# Patient Record
Sex: Female | Born: 1995 | Race: Black or African American | Hispanic: No | Marital: Single | Smoking: Never smoker
Health system: Southern US, Community
[De-identification: ages and names within clinical notes are randomized; demographics above are authoritative.]

## PROBLEM LIST (undated history)

## (undated) DIAGNOSIS — E119 Type 2 diabetes mellitus without complications: Secondary | ICD-10-CM

---

## 2013-03-23 ENCOUNTER — Encounter: Payer: BC Managed Care – PPO | Attending: Internal Medicine | Admitting: *Deleted

## 2013-03-23 DIAGNOSIS — E119 Type 2 diabetes mellitus without complications: Secondary | ICD-10-CM

## 2013-03-23 NOTE — Progress Notes (Signed)
Introduction to Insulin Pump Therapy:  Appt start time: 1500 end time:  1630.  Assessment:  This patient has DM 2 and their primary concerns today: intro to pumping, she has already received her Medtronic pump.  This patient is interested in learning more about insulin pump therapy because fewer injections and desire for better BG control  MEDICATIONS: Basal Insulin: 25 units of Lantus at bedtime via pen  Bolus Insulin: 0 units of Humalog at meals  via pen anymore Total of insulin doses per day 25 units Other diabetes medications: metformin  This patient is using Accuchek meter and currently is NOT adjusting bolus insulin based BG  This patient is NOT currently adjusting bolus insulin based on carb intake Patient states knowledge of Carb Counting is none  Usual physical activity: not with cold weather  Last A1c was 12%  Patient states complications from diabetes include not discussed, newly diagnosed 1 year ago Patient states they forget to take their insulin injection on average 2-3 times per week Patient states they have had hypoglycemia 0 times in the past month Patient states their biggest barrier with diabetes is remembering to take shot at night  Patient currently is working as Consulting civil engineer at [employer] and the schedule is variable each day  Progress Towards Obtaining an Insulin PumpGoal(s):  In progress.  Patient states their expectations of pump therapy include: get A1c down Patient expresses understanding that for improved outcomes for their diabetes on an insulin pump they will:  Check BG 3 times per day  Change out pump infusion set at least every 3 days  Upload pump information to software on a regular basis so provider can assess patterns and make setting adjustments.      Intervention:    Taught difference between delivery of insulin via syringe/pen compared to insulin pump.  Demonstrated improved insulin delivery via pump due to improved accuracy of dose and flexibility  of adjusting bolus insulin based on carb intake and BG correction.  Demonstrated pump, insulin reservoir and infusion set options, and button pushing for bolus delivery of insulin through the pump  Explained importance of testing BG at least 4 times per day for appropriate correction of high BG   Emphasized importance of follow up after Pump Start for appropriate pump setting adjustments and on-going training on more advanced features.  Also discussed Carb Counting by food group, she expressed good understanding by food model demonstration   Handouts given during visit include:  Intro to Pumping Handout  Carb Counting and Reading Food Labels handouts  Monitoring/Evaluation:    Patient does want to continue with pursuit of insulin pump.  Patient instructed to make appointment with Dr. Wendy Poet, endocrinologist before pump start can occur.    Follow up pump start to be made once she has seen Dr. Elvera Lennox

## 2013-04-20 ENCOUNTER — Ambulatory Visit: Payer: BC Managed Care – PPO | Admitting: Internal Medicine

## 2013-04-20 DIAGNOSIS — Z0289 Encounter for other administrative examinations: Secondary | ICD-10-CM

## 2014-08-20 ENCOUNTER — Encounter (HOSPITAL_COMMUNITY): Payer: Self-pay

## 2014-08-20 ENCOUNTER — Emergency Department (HOSPITAL_COMMUNITY)
Admission: EM | Admit: 2014-08-20 | Discharge: 2014-08-20 | Disposition: A | Discharge status: Home / Self Care | Payer: BLUE CROSS/BLUE SHIELD | Attending: Emergency Medicine | Admitting: Emergency Medicine

## 2014-08-20 ENCOUNTER — Emergency Department (HOSPITAL_COMMUNITY): Payer: BLUE CROSS/BLUE SHIELD

## 2014-08-20 DIAGNOSIS — R0789 Other chest pain: Secondary | ICD-10-CM | POA: Diagnosis not present

## 2014-08-20 DIAGNOSIS — E1165 Type 2 diabetes mellitus with hyperglycemia: Secondary | ICD-10-CM | POA: Insufficient documentation

## 2014-08-20 DIAGNOSIS — R079 Chest pain, unspecified: Secondary | ICD-10-CM

## 2014-08-20 DIAGNOSIS — Z794 Long term (current) use of insulin: Secondary | ICD-10-CM | POA: Diagnosis not present

## 2014-08-20 DIAGNOSIS — Z79899 Other long term (current) drug therapy: Secondary | ICD-10-CM | POA: Insufficient documentation

## 2014-08-20 HISTORY — DX: Type 2 diabetes mellitus without complications: E11.9

## 2014-08-20 LAB — CBC
HEMATOCRIT: 43.4 % (ref 36.0–46.0)
HEMOGLOBIN: 15.2 g/dL — AB (ref 12.0–15.0)
MCH: 29.9 pg (ref 26.0–34.0)
MCHC: 35 g/dL (ref 30.0–36.0)
MCV: 85.3 fL (ref 78.0–100.0)
Platelets: 193 10*3/uL (ref 150–400)
RBC: 5.09 MIL/uL (ref 3.87–5.11)
RDW: 11.7 % (ref 11.5–15.5)
WBC: 7.9 10*3/uL (ref 4.0–10.5)

## 2014-08-20 LAB — BASIC METABOLIC PANEL
ANION GAP: 12 (ref 5–15)
BUN: 6 mg/dL (ref 6–23)
CO2: 20 mmol/L (ref 19–32)
Calcium: 9.2 mg/dL (ref 8.4–10.5)
Chloride: 98 mmol/L (ref 96–112)
Creatinine, Ser: 0.63 mg/dL (ref 0.50–1.10)
GFR calc Af Amer: >90 mL/min (ref >90)
Glucose, Bld: 589 mg/dL (ref 70–99)
Potassium: 3.8 mmol/L (ref 3.5–5.1)
Sodium: 130 mmol/L — ABNORMAL LOW (ref 135–145)

## 2014-08-20 LAB — BRAIN NATRIURETIC PEPTIDE: B NATRIURETIC PEPTIDE 5: 15.4 pg/mL (ref 0.0–100.0)

## 2014-08-20 LAB — CBG MONITORING, ED
GLUCOSE-CAPILLARY: 335 mg/dL — AB (ref 70–99)
Glucose-Capillary: 552 mg/dL (ref 70–99)

## 2014-08-20 LAB — I-STAT TROPONIN, ED: Troponin i, poc: 0 ng/mL (ref 0.00–0.08)

## 2014-08-20 MED ORDER — KETOROLAC TROMETHAMINE 30 MG/ML IJ SOLN
30.0000 mg | Freq: Once | INTRAMUSCULAR | Status: AC
  Administered 2014-08-20: 30 mg via INTRAVENOUS
  Filled 2014-08-20: qty 1

## 2014-08-20 MED ORDER — SODIUM CHLORIDE 0.9 % IV BOLUS (SEPSIS)
1000.0000 mL | Freq: Once | INTRAVENOUS | Status: AC
  Administered 2014-08-20: 1000 mL via INTRAVENOUS

## 2014-08-20 MED ORDER — IBUPROFEN 800 MG PO TABS: 800.0000 mg | ORAL_TABLET | Freq: Three times a day (TID) | ORAL | Status: DC

## 2014-08-20 MED ORDER — INSULIN ASPART 100 UNIT/ML ~~LOC~~ SOLN
5.0000 [IU] | Freq: Once | SUBCUTANEOUS | Status: AC
  Administered 2014-08-20: 5 [IU] via INTRAVENOUS
  Filled 2014-08-20: qty 1

## 2014-08-20 NOTE — ED Notes (Signed)
Bed: [facility]2 Expected date:  Expected time:  Means of arrival:  Comments: EMS 19 y o F chest wall pain

## 2014-08-20 NOTE — ED Provider Notes (Signed)
CSN: [id number]     Arrival date & time 08/20/14  0252 History   First MD Initiated Contact with Patient 08/20/14 [phone]     Chief Complaint  Patient presents with  . Chest Pain     (Consider location/radiation/quality/duration/timing/severity/associated sxs/prior Treatment) Patient is a 19 y.o. female presenting with chest pain. The history is provided by the patient. No language interpreter was used.  Chest Pain Pain location:  L chest Pain quality: sharp   Pain radiates to:  Does not radiate Pain radiates to the back: no   Pain severity:  Moderate Onset quality:  Gradual Timing:  Constant Progression:  Unchanged Chronicity:  New Context: lifting and movement   Relieved by:  Nothing Worsened by:  Nothing tried Ineffective treatments:  None tried Associated symptoms: no abdominal pain, no fever, no orthopnea, no palpitations and no shortness of breath   Risk factors: no aortic disease, no immobilization and no surgery     Past Medical History  Diagnosis Date  . Diabetes mellitus without complication    History reviewed. No pertinent past surgical history. History reviewed. No pertinent family history. History  Substance Use Topics  . Smoking status: Never Smoker   . Smokeless tobacco: Not on file  . Alcohol Use: No   OB History    No data available     Review of Systems  Constitutional: Negative for fever.  Respiratory: Negative for shortness of breath.   Cardiovascular: Positive for chest pain. Negative for palpitations, orthopnea and leg swelling.  Gastrointestinal: Negative for abdominal pain.  All other systems reviewed and are negative.     Allergies  Review of patient's allergies indicates no known allergies.  Home Medications   Prior to Admission medications   Medication Sig Start Date End Date Taking? Authorizing Provider  insulin aspart (NOVOLOG) 100 UNIT/ML injection Inject 2-15 Units into the skin 3 (three) times daily before meals. Per sliding  scale.   Yes Historical Provider, MD  Insulin Glargine 300 UNIT/ML SOPN Inject 45 Units into the skin at bedtime.   Yes Historical Provider, MD  medroxyPROGESTERone (DEPO-PROVERA) 150 MG/ML injection Inject 150 mg into the muscle every 3 (three) months.   Yes Historical Provider, MD  metFORMIN (GLUCOPHAGE) 500 MG tablet Take 500 mg by mouth 2 (two) times daily with a meal.   Yes Historical Provider, MD  ibuprofen (ADVIL,MOTRIN) 800 MG tablet Take 1 tablet (800 mg total) by mouth 3 (three) times daily. 08/20/14   Daun Rens, MD   BP 118/80 mmHg  Pulse 94  Temp(Src) 97.8 F (36.6 C) (Oral)  Resp 24  Ht 5' (1.524 m)  Wt 152 lb (68.947 kg)  BMI 29.69 kg/m2  SpO2 99% Physical Exam  Constitutional: She is oriented to person, place, and time. She appears well-developed and well-nourished. No distress.  HENT:  Head: Normocephalic and atraumatic.  Mouth/Throat: Oropharynx is clear and moist.  Eyes: Conjunctivae are normal. Pupils are equal, round, and reactive to light.  Neck: Normal range of motion. Neck supple.  Cardiovascular: Normal rate, regular rhythm and intact distal pulses.   Pulmonary/Chest: Effort normal and breath sounds normal. No respiratory distress. She has no wheezes. She has no rales. She exhibits tenderness.  Abdominal: Soft. Bowel sounds are normal. There is no tenderness. There is no rebound and no guarding.  Musculoskeletal: Normal range of motion. She exhibits no edema or tenderness.  Neurological: She is alert and oriented to person, place, and time.  Skin: Skin is warm and dry.  Psychiatric: She has a normal mood and affect.    ED Course  Procedures (including critical care time) Labs Review Labs Reviewed  CBC - Abnormal; Notable for the following:    Hemoglobin 15.2 (*)    All other components within normal limits  BASIC METABOLIC PANEL - Abnormal; Notable for the following:    Sodium 130 (*)    Glucose, Bld 589 (*)    All other components within normal  limits  CBG MONITORING, ED - Abnormal; Notable for the following:    Glucose-Capillary 335 (*)    All other components within normal limits  BRAIN NATRIURETIC PEPTIDE  I-STAT TROPOININ, ED  CBG MONITORING, ED    Imaging Review Dg Chest 2 View  08/20/2014   CLINICAL DATA:  Chest pain for 30 minutes.  Chest tightness.  EXAM: CHEST  2 VIEW  COMPARISON:  None.  FINDINGS: The cardiomediastinal contours are normal. The lungs are clear. Pulmonary vasculature is normal. No consolidation, pleural effusion, or pneumothorax. No acute osseous abnormalities are seen.  IMPRESSION: Normal radiographs of the chest.   Electronically Signed   By: Rubye OaksMelanie  Ehinger M.D.   On: 08/20/2014 04:31     EKG Interpretation   Date/Time:  Friday August 20 2014 03:09:20 EDT Ventricular Rate:  79 PR Interval:  135 QRS Duration: 83 QT Interval:  370 QTC Calculation: 424 R Axis:   61 Text Interpretation:  Sinus rhythm Confirmed by [facility]  MD, Layson Bertsch  ([id number]) on 08/20/2014 4:03:54 AM      MDM   Final diagnoses:  Chest wall pain  Hyperglycemia due to type 2 diabetes mellitus    Wells 0.  No SOB.  Was primarily here as sugars were elevated due to patient not having meds.     Cy BlamerApril Alsie Younes, MD 08/20/14 [phone]

## 2014-08-20 NOTE — Discharge Instructions (Signed)

## 2014-08-20 NOTE — ED Notes (Signed)
Patient arrived via EMS after experiencing chest wall pain for 30 minutes.  No N/V/D, diaphoresis.

## 2014-08-20 NOTE — ED Notes (Signed)
Pt reports left sided chest pain that began approx 0200 this morning - pt states area is tender to palpation. Pt denies cough, dizziness or shortness of breath.

## 2016-05-28 HISTORY — PX: WISDOM TOOTH EXTRACTION: SHX21

## 2017-09-14 ENCOUNTER — Encounter (HOSPITAL_COMMUNITY): Payer: Self-pay | Admitting: Obstetrics and Gynecology

## 2017-09-14 ENCOUNTER — Other Ambulatory Visit: Payer: Self-pay

## 2017-09-14 ENCOUNTER — Emergency Department (HOSPITAL_COMMUNITY): Payer: 59

## 2017-09-14 ENCOUNTER — Emergency Department (HOSPITAL_COMMUNITY)
Admission: EM | Admit: 2017-09-14 | Discharge: 2017-09-15 | Disposition: A | Discharge status: Home / Self Care | Payer: 59 | Attending: Emergency Medicine | Admitting: Emergency Medicine

## 2017-09-14 DIAGNOSIS — Z79899 Other long term (current) drug therapy: Secondary | ICD-10-CM | POA: Diagnosis not present

## 2017-09-14 DIAGNOSIS — R1030 Lower abdominal pain, unspecified: Secondary | ICD-10-CM | POA: Diagnosis present

## 2017-09-14 DIAGNOSIS — N390 Urinary tract infection, site not specified: Secondary | ICD-10-CM | POA: Diagnosis not present

## 2017-09-14 DIAGNOSIS — E119 Type 2 diabetes mellitus without complications: Secondary | ICD-10-CM | POA: Insufficient documentation

## 2017-09-14 DIAGNOSIS — Z794 Long term (current) use of insulin: Secondary | ICD-10-CM | POA: Diagnosis not present

## 2017-09-14 LAB — URINALYSIS, ROUTINE W REFLEX MICROSCOPIC
BILIRUBIN URINE: NEGATIVE
HGB URINE DIPSTICK: NEGATIVE
Ketones, ur: 5 mg/dL — AB
NITRITE: NEGATIVE
Protein, ur: NEGATIVE mg/dL
Specific Gravity, Urine: 1.04 — ABNORMAL HIGH (ref 1.005–1.030)
pH: 7 (ref 5.0–8.0)

## 2017-09-14 LAB — COMPREHENSIVE METABOLIC PANEL
ALT: 17 U/L (ref 14–54)
ANION GAP: 11 (ref 5–15)
AST: 16 U/L (ref 15–41)
Albumin: 4 g/dL (ref 3.5–5.0)
Alkaline Phosphatase: 134 U/L — ABNORMAL HIGH (ref 38–126)
BUN: 6 mg/dL (ref 6–20)
CALCIUM: 9.6 mg/dL (ref 8.9–10.3)
CO2: 26 mmol/L (ref 22–32)
Chloride: 97 mmol/L — ABNORMAL LOW (ref 101–111)
Creatinine, Ser: 0.58 mg/dL (ref 0.44–1.00)
GFR calc Af Amer: >60 mL/min (ref >60)
Glucose, Bld: 402 mg/dL — ABNORMAL HIGH (ref 65–99)
Potassium: 3.9 mmol/L (ref 3.5–5.1)
SODIUM: 134 mmol/L — AB (ref 135–145)
TOTAL PROTEIN: 7.9 g/dL (ref 6.5–8.1)
Total Bilirubin: 0.8 mg/dL (ref 0.3–1.2)

## 2017-09-14 LAB — I-STAT BETA HCG BLOOD, ED (MC, WL, AP ONLY)

## 2017-09-14 LAB — CBC
HCT: 43.2 % (ref 36.0–46.0)
HEMOGLOBIN: 15.3 g/dL — AB (ref 12.0–15.0)
MCH: 30.9 pg (ref 26.0–34.0)
MCHC: 35.4 g/dL (ref 30.0–36.0)
MCV: 87.3 fL (ref 78.0–100.0)
PLATELETS: 214 10*3/uL (ref 150–400)
RBC: 4.95 MIL/uL (ref 3.87–5.11)
RDW: 12.1 % (ref 11.5–15.5)
WBC: 14 10*3/uL — AB (ref 4.0–10.5)

## 2017-09-14 LAB — CBG MONITORING, ED: Glucose-Capillary: 379 mg/dL — ABNORMAL HIGH (ref 65–99)

## 2017-09-14 LAB — LIPASE, BLOOD: Lipase: 21 U/L (ref 11–51)

## 2017-09-14 MED ORDER — MORPHINE SULFATE (PF) 4 MG/ML IV SOLN
4.0000 mg | Freq: Once | INTRAVENOUS | Status: AC
  Administered 2017-09-14: 4 mg via INTRAVENOUS
  Filled 2017-09-14: qty 1

## 2017-09-14 MED ORDER — SODIUM CHLORIDE 0.9 % IV SOLN
1.0000 g | Freq: Once | INTRAVENOUS | Status: AC
  Administered 2017-09-14: 1 g via INTRAVENOUS
  Filled 2017-09-14: qty 10

## 2017-09-14 MED ORDER — ONDANSETRON HCL 4 MG/2ML IJ SOLN
4.0000 mg | Freq: Once | INTRAMUSCULAR | Status: AC
  Administered 2017-09-14: 4 mg via INTRAVENOUS
  Filled 2017-09-14: qty 2

## 2017-09-14 NOTE — ED Notes (Signed)
Previous ED note written by this writer written in ERROR PT DOES NOT HAVE BC IN [facility]

## 2017-09-14 NOTE — ED Notes (Signed)
PT HAS 1 SET OF BLOOD CULTURES IN [facility] IF NEEDED

## 2017-09-14 NOTE — ED Provider Notes (Signed)
[facility]-EMERGENCY DEPT Provider Note   CSN: [id number] Arrival date & time: 09/14/17  2109     History   Chief Complaint Chief Complaint  Patient presents with  . Abdominal Pain  . Cough    HPI Lisa Whitehead is a 22 y.o. female.  Patient with past medical history remarkable for diabetes (she is uncertain which type, but takes metformin and NOT insulin) presents to the emergency department with a chief complaint suprapubic pain and pressure.  She states the symptoms started this afternoon.  She denies any dysuria or hematuria, urinary frequency or urgency.  She denies any nausea or vomiting.  Denies any fevers or chills.  She states that she has had a cough for the past 3-4 days.  She reports that it is productive in the morning, but is otherwise dry.  She denies any chest pain or shortness of breath.  She has not taken anything for symptoms.  She denies any new or unusual vaginal discharge.  She states that her symptoms are worsened with palpation of her lower abdomen and when she coughs or sneezes she can feel the pressure in her lower abdomen.  The history is provided by the patient. No language interpreter was used.    Past Medical History:  Diagnosis Date  . Diabetes mellitus without complication (HCC)     There are no active problems to display for this patient.   History reviewed. No pertinent surgical history.   OB History   None      Home Medications    Prior to Admission medications   Medication Sig Start Date End Date Taking? Authorizing Provider  ibuprofen (ADVIL,MOTRIN) 800 MG tablet Take 1 tablet (800 mg total) by mouth 3 (three) times daily. 08/20/14   Palumbo, April, MD  insulin aspart (NOVOLOG) 100 UNIT/ML injection Inject 2-15 Units into the skin 3 (three) times daily before meals. Per sliding scale.    [provider]  Insulin Glargine 300 UNIT/ML SOPN Inject 45 Units into the skin at bedtime.    [provider]    medroxyPROGESTERone (DEPO-PROVERA) 150 MG/ML injection Inject 150 mg into the muscle every 3 (three) months.    [provider]  metFORMIN (GLUCOPHAGE) 500 MG tablet Take 500 mg by mouth 2 (two) times daily with a meal.    [provider]    Family History No family history on file.  Social History Social History   Tobacco Use  . Smoking status: Never Smoker  . Smokeless tobacco: Never Used  Substance Use Topics  . Alcohol use: No  . Drug use: No     Allergies   Patient has no known allergies.   Review of Systems Review of Systems  All other systems reviewed and are negative.    Physical Exam Updated Vital Signs BP (!) 126/96 (BP Location: Left Arm)   Pulse (!) 130   Temp (S) 98.4 F (36.9 C) (Oral) Comment: Repeat vitals  Resp 18   LMP 08/28/2017 (Exact Date)   SpO2 100%   Physical Exam  Constitutional: She is oriented to person, place, and time. She appears well-developed and well-nourished.  HENT:  Head: Normocephalic and atraumatic.  Eyes: Pupils are equal, round, and reactive to light. Conjunctivae and EOM are normal.  Neck: Normal range of motion. Neck supple.  Cardiovascular: Normal rate and regular rhythm. Exam reveals no gallop and no friction rub.  No murmur heard. Pulmonary/Chest: Effort normal and breath sounds normal. No respiratory distress. She  has no wheezes. She has no rales. She exhibits no tenderness.  Abdominal: Soft. Bowel sounds are normal. She exhibits no distension and no mass. There is no tenderness. There is no rebound and no guarding.  Mild suprapubic tenderness, no other focal tenderness  Musculoskeletal: Normal range of motion. She exhibits no edema or tenderness.  Neurological: She is alert and oriented to person, place, and time.  Skin: Skin is warm and dry.  Psychiatric: She has a normal mood and affect. Her behavior is normal. Judgment and thought content normal.  Nursing note and vitals reviewed.    ED  Treatments / Results  Labs (all labs ordered are listed, but only abnormal results are displayed) Labs Reviewed  COMPREHENSIVE METABOLIC PANEL - Abnormal; Notable for the following components:      Result Value   Sodium 134 (*)    Chloride 97 (*)    Glucose, Bld 402 (*)    Alkaline Phosphatase 134 (*)    All other components within normal limits  CBC - Abnormal; Notable for the following components:   WBC 14.0 (*)    Hemoglobin 15.3 (*)    All other components within normal limits  URINALYSIS, ROUTINE W REFLEX MICROSCOPIC - Abnormal; Notable for the following components:   Color, Urine STRAW (*)    APPearance HAZY (*)    Specific Gravity, Urine 1.040 (*)    Glucose, UA >=500 (*)    Ketones, ur 5 (*)    Leukocytes, UA MODERATE (*)    Bacteria, UA RARE (*)    Squamous Epithelial / LPF 6-30 (*)    All other components within normal limits  CBG MONITORING, ED - Abnormal; Notable for the following components:   Glucose-Capillary 379 (*)    All other components within normal limits  LIPASE, BLOOD  I-STAT BETA HCG BLOOD, ED ([facility], [facility], [facility] ONLY)    EKG None  Radiology Dg Chest 2 View  Result Date: 09/14/2017 CLINICAL DATA:  Cough for 4 days EXAM: CHEST - 2 VIEW COMPARISON:  08/20/2014 FINDINGS: The heart size and mediastinal contours are within normal limits. Both lungs are clear. The visualized skeletal structures are unremarkable. IMPRESSION: No active cardiopulmonary disease. Electronically Signed   By: Jasmine Pang M.D.   On: 09/14/2017 23:01    Procedures Procedures (including critical care time)  Medications Ordered in ED Medications  morphine 4 MG/ML injection 4 mg (has no administration in time range)  ondansetron (ZOFRAN) injection 4 mg (has no administration in time range)     Initial Impression / Assessment and Plan / ED Course  I have reviewed the triage vital signs and the nursing notes.  Pertinent labs & imaging results that were available during my care of  the patient were reviewed by me and considered in my medical decision making (see chart for details).     Patient with suprapubic abdominal pain and tenderness with palpation.  She reports some pressure with urination.  She is afebrile, but is noted to be tachycardic.  Onset of symptoms was today.  She is noted to be hyperglycemic.  We will give fluids.  Will check labs, chest x-ray due to cough times 3-4 days, and will reassess.  Final Clinical Impressions(s) / ED Diagnoses   Final diagnoses:  Urinary tract infection without hematuria, site unspecified    ED Discharge Orders    None       Roxy Horseman, PA-C 09/15/17 0049    Charlynne Pander, MD 09/15/17 [phone]

## 2017-09-14 NOTE — ED Triage Notes (Signed)
Per  Pt: Pt reports pressure/pain when she urinates and reports she has had some coughing and abdominal pain. Pt reports the pain is worse in her belly after she coughs but she feels lots of pressure when she tries to urinate.  Pt reports she is sexually active and uses condoms for protection. Last period April 3rd.

## 2017-09-15 MED ORDER — SODIUM CHLORIDE 0.9 % IV BOLUS
2000.0000 mL | Freq: Once | INTRAVENOUS | Status: AC
  Administered 2017-09-15: 2000 mL via INTRAVENOUS

## 2017-09-15 MED ORDER — CEPHALEXIN 500 MG PO CAPS: 500.0000 mg | ORAL_CAPSULE | Freq: Two times a day (BID) | ORAL | 0 refills | Status: DC

## 2018-08-12 ENCOUNTER — Ambulatory Visit: Payer: Self-pay | Admitting: Internal Medicine

## 2019-03-11 ENCOUNTER — Ambulatory Visit: Payer: 59 | Admitting: Family Medicine

## 2019-09-25 ENCOUNTER — Telehealth: Payer: Self-pay | Admitting: Adult Health Nurse Practitioner

## 2019-09-25 ENCOUNTER — Other Ambulatory Visit: Payer: Self-pay

## 2019-09-25 ENCOUNTER — Ambulatory Visit: Payer: 59 | Admitting: Adult Health Nurse Practitioner

## 2019-09-25 VITALS — BP 125/89 | HR 108 | Temp 97.8°F | Ht 60.0 in | Wt 133.6 lb

## 2019-09-25 DIAGNOSIS — Z7689 Persons encountering health services in other specified circumstances: Secondary | ICD-10-CM

## 2019-09-25 DIAGNOSIS — E0810 Diabetes mellitus due to underlying condition with ketoacidosis without coma: Secondary | ICD-10-CM | POA: Diagnosis not present

## 2019-09-25 DIAGNOSIS — Z23 Encounter for immunization: Secondary | ICD-10-CM | POA: Diagnosis not present

## 2019-09-25 DIAGNOSIS — E782 Mixed hyperlipidemia: Secondary | ICD-10-CM | POA: Diagnosis not present

## 2019-09-25 DIAGNOSIS — E139 Other specified diabetes mellitus without complications: Secondary | ICD-10-CM | POA: Diagnosis not present

## 2019-09-25 DIAGNOSIS — E1369 Other specified diabetes mellitus with other specified complication: Secondary | ICD-10-CM

## 2019-09-25 LAB — POCT URINALYSIS DIP (MANUAL ENTRY)
Bilirubin, UA: NEGATIVE
Glucose, UA: >=1000 mg/dL — AB
Ketones, POC UA: NEGATIVE mg/dL
Nitrite, UA: NEGATIVE
Protein Ur, POC: NEGATIVE mg/dL
Spec Grav, UA: 1.015 (ref 1.010–1.025)
Urobilinogen, UA: 0.2 E.U./dL
pH, UA: 6.5 (ref 5.0–8.0)

## 2019-09-25 LAB — GLUCOSE, POCT (MANUAL RESULT ENTRY): POC Glucose: 386 mg/dl — AB (ref 70–99)

## 2019-09-25 MED ORDER — ATORVASTATIN CALCIUM 10 MG PO TABS: 10.0000 mg | ORAL_TABLET | Freq: Every day | ORAL | 3 refills | Status: DC

## 2019-09-25 MED ORDER — SITAGLIPTIN PHOSPHATE 100 MG PO TABS: 100.0000 mg | ORAL_TABLET | Freq: Every day | ORAL | 0 refills | Status: DC

## 2019-09-25 NOTE — Telephone Encounter (Signed)
Pt stated that her insurance will not cover Januvia an she will check with insurance for alternative options and call back.  Pt also asked about refill for Glipizide and if it will be sent to the pharmacy / please advise

## 2019-09-25 NOTE — Telephone Encounter (Signed)
Medication sent to pt pharmacy. She will call bk with with the formulary med ins will pay for.

## 2019-09-25 NOTE — Progress Notes (Signed)
10/01/2019  Margy Clarks [birth year] [id number]   Subjective:     Lisa Whitehead is a 24 y.o. female who presents as a new patient to clinic and to myself.  She wishes to establish for her Diabetes. Current symptoms include: none. Patient denies hypoglycemia , polydipsia and polyuria. Evaluation to date has been: fasting blood sugar and hemoglobin A1C. Home sugars: patient does not check sugars. Current treatments: no recent interventions. Last dilated eye exam over 1 year ago.  She reports that she was diagnosed with type 2 diabetes when she was 68.  Initially had an A1c of greater than 10 and was put immediately on insulin.  She felt that nobody listened to her concerning her diabetes.  She reports that when she was on  Glipizide and janumet--a1c 7.% and that is the lowest it has been.  Has not been on this in over 3-4 months.    She does not take blood sugar daily.  Last blood sugar was a few weeks ago and was in the 400s.  She does remember if/when her blood sugar was normal or regulated other than with the Glipizide and Janumet.    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  Reports no symptoms of polydipsia, polyuria, fatigue.  Energy is good.  Denies dizziness or headache.  She feels that when she does have symptoms, it is because her blood sugar is high and will often skip meals.  Eats probably 1x a day.  Just recently looked into carb counting and is trying to make small changes.   It is unclear whether she is a Type II Diabetic or Type I a/b, subtype diabetic.  Family hx is significant for diabetes in mother diagnosed in her mom when in 60s.  Controlled.  She declines a referral to endo. Her goal is to establish with a PCP and manage blood sugar without insulin initially.   1-2 months of taking pills   The following portions of the patient's history were reviewed and updated as appropriate: allergies, current medications,  past family history, past medical history, past social history, past surgical history and problem list.  Review of Systems A comprehensive review of systems was negative except for: Constitutional: positive for anorexia Behavioral/Psych: positive for anxiety    Objective:    BP 125/89 (BP Location: Right Arm, Patient Position: Sitting, Cuff Size: Normal)   Pulse (!) 108   Temp 97.8 F (36.6 C) (Temporal)   Ht 5' (1.524 m)   Wt 133 lb 9.6 oz (60.6 kg)   LMP 09/14/2019   SpO2 98%   BMI 26.09 kg/m  General appearance: alert and appears stated age Lungs: clear to auscultation bilaterally Heart: regular rate and rhythm, S1, S2 normal, no murmur, click, rub or gallop  Exam of extremities: peripheral pulses normal, no pedal edema, no clubbing or cyanosis Skin exam - normal coloration and turgor, no rashes, no suspicious skin lesions noted. Mental Status: normal mood, behavior, speech, dress, motor activity, and thought processes.      Laboratory: Blood sugar today for POCT was 402.   UA showed trace leuks, >1000 glucose, trace blood Assessment:    Diabetes unknown type   1. Establishing care with new doctor, encounter for   2. Other specified diabetes mellitus with other specified complication, unspecified whether long term insulin use ([place])   3. Need for prophylactic vaccination with combined diphtheria-tetanus-pertussis (DTP) vaccine   4.  Diabetes mellitus due to underlying condition with ketoacidosis without coma, without long-term current use of insulin ([place])   5. Other specified diabetes mellitus without complication, without long-term current use of insulin ([place])   6. Mixed hyperlipidemia        Plan:    Discussed general issues about diabetes pathophysiology and management. Counseling at today's visit: discussed the advantages of a diet low in carbohydrates. Neurosurgeon distributed. Encouraged aerobic exercise. Discussed ways to avoid symptomatic  hypoglycemia. Labs: fasting blood sugar and hemoglobin A1C. Diabetes educator referral. Nutritionist referral. Reminded to bring in blood sugar diary at next visit. Follow up in 1 week or as needed.     Orders Placed This Encounter  Procedures  . Tdap vaccine greater than or equal to 7yo IM  . Microalbumin, urine  . CMP14+EGFR  . TSH  . Lipid panel  . Hemoglobin A1c  . POCT urinalysis dipstick  . POCT glucose (manual entry)   Meds ordered this encounter  Medications  . sitaGLIPtin (JANUVIA) 100 MG tablet    Sig: Take 1 tablet (100 mg total) by mouth daily.    Dispense:  30 tablet    Refill:  0  . atorvastatin (LIPITOR) 10 MG tablet    Sig: Take 1 tablet (10 mg total) by mouth daily.    Dispense:  90 tablet    Refill:  3   A total of 50 minutes were spent face-to-face with the patient during this encounter and over half of that time was spent on counseling and coordination of care.  Spent the majority of my face to face time explaining why her blood sugar levels were dangerous and the medical recommendation for insulin.  Despite a long  Conversation, she will only start oral medication but did agree to monitor BS and return with a log.     Patient presents with a complicated past medical history of type 2 diabetes that started at age 49.  She states because of the stress of her life and her minimal symptoms, she has not wanted to follow up with endocrinology or anyone else for her diabetes.  She feels she does not get hypoglycemic and she does not think that insulin works for her because it never brought down her A1c in the past.  She is adamant about not wanting to try insulin to start.  She would like to try what worked for her previously which was the Janumet and glipizide.  We discussed the risk and benefits of taking an oral medication when her blood sugar is in the 400s and has been running that way probably for quite some time.  However, it is clear that she would not be  compliant with taking the insulin and/or checking blood sugars which would need to be completed on a daily basis in order to prevent hypoglycemia.  I have tried to work with the patient and asked her to start checking her blood sugars and bring in readings at the next visit which should be in 1 week.  I advised that we check her kidney function, liver function A1c and all relevant blood work to establish a baseline and to evaluate if there is any damage.  I did some education regarding diet and carb counting for diabetes.  Really stressed the importance of potential hypoglycemia and/or damage to target organs with continued blood sugar over 400.  She verbalizes understanding but would like to stick with the plan of oral medications.  I did refer her to  diabetes education as well as nutrition.  She is reluctant to go but encouraged her to follow-up as this would give her a great deal of information in one setting and insurance would cover.  She will think about it.    This is not my preferred strategy of management.  I explained to the patient that my best advice would be to put her back on insulin and then work on other parameters of blood sugar and eating hoping to get off of it at some point.  I had a discussion about all serious risk with her and she stated that she understood.  I have started her on Januvia as I am not sure what her kidney function is at this point so would not start Metformin immediately.  Also would need to figure out the type of diabetes she has.  I did ask her to consider going back to the endocrinologist even if it is not right now that she do so within the next 1 to 3 months.  At this point, she declines.  She is okay with checking all lab work and returning for follow-up.  Patient states that she will not be treating her diabetes if she is not allowed to take the medication that originally work for her.  I told her that we could do that but will need close follow-up to determine if  not working.  She is in line with this plan.  Follow-up in 1 week.  Glyn Ade, NP .

## 2019-09-25 NOTE — Patient Instructions (Signed)
° ° ° °  If you have lab work done today you will be contacted with your lab results within the next 2 weeks.  If you have not heard from us then please contact us. The fastest way to get your results is to register for My Chart. ° ° °IF you received an x-ray today, you will receive an invoice from [facility]. Please contact  Radiology at [phone] with questions or concerns regarding your invoice.  ° °IF you received labwork today, you will receive an invoice from [facility]. Please contact [facility] at [phone] with questions or concerns regarding your invoice.  ° °Our billing staff will not be able to assist you with questions regarding bills from these companies. ° °You will be contacted with the lab results as soon as they are available. The fastest way to get your results is to activate your My Chart account. Instructions are located on the last page of this paperwork. If you have not heard from us regarding the results in 2 weeks, please contact this office. °  ° ° ° °

## 2019-09-26 LAB — HEMOGLOBIN A1C

## 2019-09-26 LAB — LIPID PANEL
Cholesterol, Total: 260 mg/dL — ABNORMAL HIGH (ref 100–199)
VLDL Cholesterol Cal: 21 mg/dL (ref 5–40)

## 2019-09-26 LAB — CMP14+EGFR
AST: 14 IU/L (ref 0–40)
BUN: 5 mg/dL — ABNORMAL LOW (ref 6–20)
CO2: 23 mmol/L (ref 20–29)
Creatinine, Ser: 0.78 mg/dL (ref 0.57–1.00)
Glucose: 402 mg/dL — ABNORMAL HIGH (ref 65–99)
Sodium: 133 mmol/L — ABNORMAL LOW (ref 134–144)
Total Protein: 7.4 g/dL (ref 6.0–8.5)

## 2019-09-26 LAB — MICROALBUMIN, URINE: Microalbumin, Urine: 14 ug/mL

## 2019-09-28 LAB — LIPID PANEL
Chol/HDL Ratio: 3.4 ratio (ref 0.0–4.4)
HDL: 76 mg/dL (ref >39)
LDL Chol Calc (NIH): 163 mg/dL — ABNORMAL HIGH (ref 0–99)
Triglycerides: 119 mg/dL (ref 0–149)

## 2019-09-28 LAB — CMP14+EGFR
ALT: 19 IU/L (ref 0–32)
Albumin/Globulin Ratio: 1.5 (ref 1.2–2.2)
Albumin: 4.4 g/dL (ref 3.9–5.0)
Alkaline Phosphatase: 147 IU/L — ABNORMAL HIGH (ref 39–117)
BUN/Creatinine Ratio: 6 — ABNORMAL LOW (ref 9–23)
Bilirubin Total: 0.9 mg/dL (ref 0.0–1.2)
Calcium: 9.5 mg/dL (ref 8.7–10.2)
Chloride: 94 mmol/L — ABNORMAL LOW (ref 96–106)
GFR calc Af Amer: 122 mL/min/{1.73_m2} (ref >59)
GFR calc non Af Amer: 106 mL/min/{1.73_m2} (ref >59)
Globulin, Total: 3 g/dL (ref 1.5–4.5)
Potassium: 3.8 mmol/L (ref 3.5–5.2)

## 2019-09-28 LAB — TSH: TSH: 0.911 u[IU]/mL (ref 0.450–4.500)

## 2019-10-01 ENCOUNTER — Encounter: Payer: Self-pay | Admitting: Adult Health Nurse Practitioner

## 2019-10-01 DIAGNOSIS — E1165 Type 2 diabetes mellitus with hyperglycemia: Secondary | ICD-10-CM | POA: Insufficient documentation

## 2019-10-01 DIAGNOSIS — E785 Hyperlipidemia, unspecified: Secondary | ICD-10-CM | POA: Insufficient documentation

## 2019-10-01 DIAGNOSIS — IMO0002 Reserved for concepts with insufficient information to code with codable children: Secondary | ICD-10-CM | POA: Insufficient documentation

## 2019-10-01 DIAGNOSIS — E119 Type 2 diabetes mellitus without complications: Secondary | ICD-10-CM

## 2019-10-01 HISTORY — DX: Hyperlipidemia, unspecified: E78.5

## 2019-10-01 HISTORY — DX: Type 2 diabetes mellitus without complications: E11.9

## 2019-10-13 ENCOUNTER — Ambulatory Visit: Payer: 59 | Admitting: Adult Health Nurse Practitioner

## 2019-10-18 ENCOUNTER — Other Ambulatory Visit: Payer: Self-pay

## 2019-10-18 ENCOUNTER — Emergency Department (HOSPITAL_COMMUNITY)
Admission: EM | Admit: 2019-10-18 | Discharge: 2019-10-18 | Disposition: A | Discharge status: Home / Self Care | Payer: 59 | Attending: Emergency Medicine | Admitting: Emergency Medicine

## 2019-10-18 DIAGNOSIS — E119 Type 2 diabetes mellitus without complications: Secondary | ICD-10-CM | POA: Diagnosis not present

## 2019-10-18 DIAGNOSIS — Z7984 Long term (current) use of oral hypoglycemic drugs: Secondary | ICD-10-CM | POA: Diagnosis not present

## 2019-10-18 DIAGNOSIS — J029 Acute pharyngitis, unspecified: Secondary | ICD-10-CM | POA: Diagnosis present

## 2019-10-18 DIAGNOSIS — J020 Streptococcal pharyngitis: Secondary | ICD-10-CM | POA: Diagnosis not present

## 2019-10-18 LAB — I-STAT BETA HCG BLOOD, ED (MC, WL, AP ONLY): I-stat hCG, quantitative: <5 m[IU]/mL (ref <5)

## 2019-10-18 LAB — GROUP A STREP BY PCR: Group A Strep by PCR: DETECTED — AB

## 2019-10-18 MED ORDER — AMOXICILLIN 500 MG PO CAPS: 1000.0000 mg | ORAL_CAPSULE | Freq: Every day | ORAL | 0 refills | Status: AC

## 2019-10-18 MED ORDER — AMOXICILLIN 500 MG PO CAPS: 1000.0000 mg | ORAL_CAPSULE | Freq: Every day | ORAL | 0 refills | Status: DC

## 2019-10-18 MED ORDER — LIDOCAINE VISCOUS HCL 2 % MT SOLN: 15.0000 mL | OROMUCOSAL | 0 refills | Status: DC | PRN

## 2019-10-18 MED ORDER — KETOROLAC TROMETHAMINE 30 MG/ML IJ SOLN
30.0000 mg | Freq: Once | INTRAMUSCULAR | Status: AC
  Administered 2019-10-18: 30 mg via INTRAMUSCULAR
  Filled 2019-10-18: qty 1

## 2019-10-18 NOTE — ED Triage Notes (Signed)
Per patient, she woke up today with a sore throat. Pain rated 8/10

## 2019-10-18 NOTE — ED Provider Notes (Signed)
[facility] DEPT Provider Note   CSN: [id number] Arrival date & time: 10/18/19  1026     History Chief Complaint  Patient presents with  . Sore Throat    Lisa Whitehead is a 24 y.o. female with past medical history of diabetes presenting to the ED with a chief complaint of sore throat.  Yesterday started having a dull ache in her throat.  She woke up this morning with worsening pain that is worse with swallowing and eating. She has not tried any medications to help with her symptoms. States she has not had a sore throat like this in the past.  She denies cough, fever, neck pain or stiffness, sick contacts with similar symptoms, Covid exposures, congestion or ear pain.  HPI     Past Medical History:  Diagnosis Date  . Diabetes ([place]) 10/01/2019  . Diabetes mellitus without complication ([place])   . Hyperlipemia 10/01/2019    Patient Active Problem List   Diagnosis Date Noted  . Severe uncontrolled diabetes mellitus ([place]) 10/01/2019  . Hyperlipemia 10/01/2019    No past surgical history on file.   OB History   No obstetric history on file.     No family history on file.  Social History   Tobacco Use  . Smoking status: Never Smoker  . Smokeless tobacco: Never Used  Substance Use Topics  . Alcohol use: No  . Drug use: No    Home Medications Prior to Admission medications   Medication Sig Start Date End Date Taking? Authorizing Provider  atorvastatin (LIPITOR) 10 MG tablet Take 1 tablet (10 mg total) by mouth daily. 09/25/19  Yes Wendall Mola, NP  medroxyPROGESTERone (DEPO-PROVERA) 150 MG/ML injection Inject 150 mg into the muscle every 3 (three) months.   Yes [provider]  sitaGLIPtin (JANUVIA) 100 MG tablet Take 1 tablet (100 mg total) by mouth daily. 09/25/19  Yes Wendall Mola, NP  amoxicillin (AMOXIL) 500 MG capsule Take 2 capsules (1,000 mg total) by mouth daily for 10 days. 10/18/19 10/28/19  Natayla Cadenhead, PA-C   cephALEXin (KEFLEX) 500 MG capsule Take 1 capsule (500 mg total) by mouth 2 (two) times daily. Patient not taking: Reported on 09/25/2019 09/15/17   Montine Circle, PA-C  ibuprofen (ADVIL,MOTRIN) 800 MG tablet Take 1 tablet (800 mg total) by mouth 3 (three) times daily. Patient not taking: Reported on 09/25/2019 08/20/14   Palumbo, April, MD  lidocaine (XYLOCAINE) 2 % solution Use as directed 15 mLs in the mouth or throat as needed for mouth pain. 10/18/19   Delia Heady, PA-C    Allergies    Patient has no known allergies.  Review of Systems   Review of Systems  Constitutional: Negative for chills and fever.  HENT: Positive for sore throat. Negative for congestion and postnasal drip.   Respiratory: Negative for cough.   Musculoskeletal: Negative for neck pain.    Physical Exam Updated Vital Signs BP 116/84 (BP Location: Right Arm)   Pulse 98   Temp 98.9 F (37.2 C) (Oral)   Resp 16   Ht 5' (1.524 m)   Wt 63.5 kg   LMP 10/11/2019   SpO2 100%   BMI 27.34 kg/m   Physical Exam Vitals and nursing note reviewed.  Constitutional:      General: She is not in acute distress.    Appearance: She is well-developed. She is not diaphoretic.  HENT:     Head: Normocephalic and atraumatic.     Mouth/Throat:  Pharynx: Posterior oropharyngeal erythema present.     Comments: Patient does not appear to be in acute distress. No trismus or drooling present. No pooling of secretions. Patient is tolerating secretions and is not in respiratory distress. No neck pain or tenderness to palpation of the neck. Full active and passive range of motion of the neck. No evidence of RPA or PTA. Eyes:     General: No scleral icterus.    Conjunctiva/sclera: Conjunctivae normal.  Pulmonary:     Effort: Pulmonary effort is normal. No respiratory distress.  Musculoskeletal:     Cervical back: Normal range of motion.  Skin:    Findings: No rash.  Neurological:     Mental Status: She is alert.     ED  Results / Procedures / Treatments   Labs (all labs ordered are listed, but only abnormal results are displayed) Labs Reviewed  GROUP A STREP BY PCR - Abnormal; Notable for the following components:      Result Value   Group A Strep by PCR DETECTED (*)    All other components within normal limits  I-STAT BETA HCG BLOOD, ED ([facility], [facility], [facility] ONLY)    EKG None  Radiology No results found.  Procedures Procedures (including critical care time)  Medications Ordered in ED Medications  ketorolac (TORADOL) 30 MG/ML injection 30 mg (30 mg Intramuscular Given 10/18/19 1255)    ED Course  I have reviewed the triage vital signs and the nursing notes.  Pertinent labs & imaging results that were available during my care of the patient were reviewed by me and considered in my medical decision making (see chart for details).    MDM Rules/Calculators/A&P                      Pt rapid strep test positive. Pt is tolerating secretions, not in respiratory distress, no neck pain, no trismus. Presentation not concerning for peritonsillar abscess or spread of infection to deep spaces of the throat; patent airway. Pt will be discharged with amoxicillin and lidocaine solution for discomfort. Ibuprofen or Tylenol as needed for pain/fever. Specific return precautions discussed. Recommended PCP follow up. Pt appears safe for discharge. Pregnancy test negative here, given IM toradol for relief.    Patient is hemodynamically stable, in NAD, and able to ambulate in the ED. Evaluation does not show pathology that would require ongoing emergent intervention or inpatient treatment. I explained the diagnosis to the patient. Pain has been managed and has no complaints prior to discharge. Patient is comfortable with above plan and is stable for discharge at this time. All questions were answered prior to disposition. Strict return precautions for returning to the ED were discussed. Encouraged follow up with PCP.   An  After Visit Summary was printed and given to the patient.   Portions of this note were generated with Scientist, clinical (histocompatibility and immunogenetics). Dictation errors may occur despite best attempts at proofreading.   Final Clinical Impression(s) / ED Diagnoses Final diagnoses:  Strep throat    Rx / DC Orders ED Discharge Orders         Ordered    amoxicillin (AMOXIL) 500 MG capsule  Daily     10/18/19 1314    lidocaine (XYLOCAINE) 2 % solution  As needed     10/18/19 1314           Dietrich Pates, PA-C 10/18/19 1316    Terrilee Files, MD 10/18/19 1859

## 2019-10-18 NOTE — Discharge Instructions (Addendum)
Take antibiotics to help with your strep throat. You can swish and spit the lidocaine to help with throat discomfort. Follow-up with your primary care provider. You can take Tylenol and ibuprofen as needed to help with pain and fevers. Return to the ER for worsening pain, swelling, neck stiffness or trouble breathing.

## 2019-10-21 ENCOUNTER — Telehealth: Payer: 59

## 2020-04-18 ENCOUNTER — Inpatient Hospital Stay (HOSPITAL_BASED_OUTPATIENT_CLINIC_OR_DEPARTMENT_OTHER): Payer: 59

## 2020-04-18 ENCOUNTER — Other Ambulatory Visit: Payer: Self-pay

## 2020-04-18 ENCOUNTER — Encounter (HOSPITAL_COMMUNITY): Payer: Self-pay | Admitting: Obstetrics and Gynecology

## 2020-04-18 ENCOUNTER — Observation Stay (HOSPITAL_COMMUNITY)
Admission: AD | Admit: 2020-04-18 | Discharge: 2020-04-20 | Disposition: A | Discharge status: Home / Self Care | Payer: 59 | Attending: Obstetrics and Gynecology | Admitting: Obstetrics and Gynecology

## 2020-04-18 DIAGNOSIS — Z3A01 Less than 8 weeks gestation of pregnancy: Secondary | ICD-10-CM

## 2020-04-18 DIAGNOSIS — Z20822 Contact with and (suspected) exposure to covid-19: Secondary | ICD-10-CM | POA: Diagnosis not present

## 2020-04-18 DIAGNOSIS — E1165 Type 2 diabetes mellitus with hyperglycemia: Secondary | ICD-10-CM | POA: Diagnosis not present

## 2020-04-18 DIAGNOSIS — O24119 Pre-existing diabetes mellitus, type 2, in pregnancy, unspecified trimester: Secondary | ICD-10-CM

## 2020-04-18 DIAGNOSIS — O24111 Pre-existing diabetes mellitus, type 2, in pregnancy, first trimester: Secondary | ICD-10-CM | POA: Diagnosis present

## 2020-04-18 DIAGNOSIS — O24311 Unspecified pre-existing diabetes mellitus in pregnancy, first trimester: Secondary | ICD-10-CM | POA: Diagnosis present

## 2020-04-18 DIAGNOSIS — O24919 Unspecified diabetes mellitus in pregnancy, unspecified trimester: Secondary | ICD-10-CM | POA: Diagnosis not present

## 2020-04-18 DIAGNOSIS — Z3A00 Weeks of gestation of pregnancy not specified: Secondary | ICD-10-CM | POA: Insufficient documentation

## 2020-04-18 DIAGNOSIS — IMO0002 Reserved for concepts with insufficient information to code with codable children: Secondary | ICD-10-CM

## 2020-04-18 LAB — URINALYSIS, ROUTINE W REFLEX MICROSCOPIC
Bilirubin Urine: NEGATIVE
Glucose, UA: >=500 mg/dL — AB
Hgb urine dipstick: NEGATIVE
Ketones, ur: 20 mg/dL — AB
Nitrite: NEGATIVE
Protein, ur: 30 mg/dL — AB
Specific Gravity, Urine: 1.028 (ref 1.005–1.030)
pH: 6 (ref 5.0–8.0)

## 2020-04-18 LAB — COMPREHENSIVE METABOLIC PANEL
ALT: 21 U/L (ref 0–44)
AST: 17 U/L (ref 15–41)
Albumin: 3.5 g/dL (ref 3.5–5.0)
Alkaline Phosphatase: 89 U/L (ref 38–126)
Anion gap: 12 (ref 5–15)
BUN: 6 mg/dL (ref 6–20)
CO2: 22 mmol/L (ref 22–32)
Calcium: 9.2 mg/dL (ref 8.9–10.3)
Chloride: 98 mmol/L (ref 98–111)
Creatinine, Ser: 0.7 mg/dL (ref 0.44–1.00)
GFR, Estimated: >60 mL/min (ref >60)
Glucose, Bld: 326 mg/dL — ABNORMAL HIGH (ref 70–99)
Potassium: 3.5 mmol/L (ref 3.5–5.1)
Sodium: 132 mmol/L — ABNORMAL LOW (ref 135–145)
Total Bilirubin: 1 mg/dL (ref 0.3–1.2)
Total Protein: 7.4 g/dL (ref 6.5–8.1)

## 2020-04-18 LAB — GLUCOSE, CAPILLARY
Glucose-Capillary: 295 mg/dL — ABNORMAL HIGH (ref 70–99)
Glucose-Capillary: 214 mg/dL — ABNORMAL HIGH (ref 70–99)
Glucose-Capillary: 129 mg/dL — ABNORMAL HIGH (ref 70–99)
Glucose-Capillary: 102 mg/dL — ABNORMAL HIGH (ref 70–99)
Glucose-Capillary: 89 mg/dL (ref 70–99)

## 2020-04-18 LAB — RPR: RPR Ser Ql: NONREACTIVE

## 2020-04-18 LAB — DIFFERENTIAL
Abs Immature Granulocytes: 0.02 10*3/uL (ref 0.00–0.07)
Basophils Absolute: 0.1 10*3/uL (ref 0.0–0.1)
Basophils Relative: 1 %
Eosinophils Absolute: 0 10*3/uL (ref 0.0–0.5)
Eosinophils Relative: 0 %
Immature Granulocytes: 0 %
Lymphocytes Relative: 12 %
Lymphs Abs: 1.2 10*3/uL (ref 0.7–4.0)
Monocytes Absolute: 0.6 10*3/uL (ref 0.1–1.0)
Monocytes Relative: 6 %
Neutro Abs: 8.2 10*3/uL — ABNORMAL HIGH (ref 1.7–7.7)
Neutrophils Relative %: 81 %

## 2020-04-18 LAB — RESPIRATORY PANEL BY RT PCR (FLU A&B, COVID)
Influenza A by PCR: NEGATIVE
Influenza B by PCR: NEGATIVE
SARS Coronavirus 2 by RT PCR: NEGATIVE

## 2020-04-18 LAB — TYPE AND SCREEN
ABO/RH(D): O POS
Antibody Screen: NEGATIVE

## 2020-04-18 LAB — CBC
HCT: 38.7 % (ref 36.0–46.0)
Hemoglobin: 13.1 g/dL (ref 12.0–15.0)
MCH: 28.8 pg (ref 26.0–34.0)
MCHC: 33.9 g/dL (ref 30.0–36.0)
MCV: 85.1 fL (ref 80.0–100.0)
Platelets: 242 10*3/uL (ref 150–400)
RBC: 4.55 MIL/uL (ref 3.87–5.11)
RDW: 11.7 % (ref 11.5–15.5)
WBC: 10.1 10*3/uL (ref 4.0–10.5)
nRBC: 0 % (ref 0.0–0.2)

## 2020-04-18 LAB — HEMOGLOBIN A1C
Hgb A1c MFr Bld: 10.3 % — ABNORMAL HIGH (ref 4.8–5.6)
Mean Plasma Glucose: 248.91 mg/dL

## 2020-04-18 LAB — HIV ANTIBODY (ROUTINE TESTING W REFLEX): HIV Screen 4th Generation wRfx: NONREACTIVE

## 2020-04-18 LAB — HEPATITIS B SURFACE ANTIGEN: Hepatitis B Surface Ag: NONREACTIVE

## 2020-04-18 MED ORDER — INSULIN ASPART 100 UNIT/ML ~~LOC~~ SOLN
4.0000 [IU] | Freq: Three times a day (TID) | SUBCUTANEOUS | Status: DC
  Administered 2020-04-18 – 2020-04-20 (×6): 4 [IU] via SUBCUTANEOUS

## 2020-04-18 MED ORDER — INSULIN NPH (HUMAN) (ISOPHANE) 100 UNIT/ML ~~LOC~~ SUSP
15.0000 [IU] | Freq: Two times a day (BID) | SUBCUTANEOUS | Status: DC
  Administered 2020-04-18 – 2020-04-20 (×5): 15 [IU] via SUBCUTANEOUS
  Filled 2020-04-18: qty 10

## 2020-04-18 MED ORDER — INSULIN ASPART 100 UNIT/ML ~~LOC~~ SOLN
0.0000 [IU] | Freq: Three times a day (TID) | SUBCUTANEOUS | Status: DC
  Administered 2020-04-18: 2 [IU] via SUBCUTANEOUS
  Administered 2020-04-18: 4 [IU] via SUBCUTANEOUS
  Administered 2020-04-19 – 2020-04-20 (×3): 2 [IU] via SUBCUTANEOUS

## 2020-04-18 MED ORDER — METFORMIN HCL 500 MG PO TABS
500.0000 mg | ORAL_TABLET | Freq: Two times a day (BID) | ORAL | Status: DC
  Administered 2020-04-18 – 2020-04-20 (×4): 500 mg via ORAL
  Filled 2020-04-18 (×4): qty 1

## 2020-04-18 NOTE — Consult Note (Signed)
MFM Note  Lisa Whitehead is a 24 year old gravida 1 para 0 who was seen in consultation today due to uncontrolled pregestational (type 2) diabetes.  The patient had been treated with Januvia for her diabetes outside of pregnancy.  However, she discontinued the medication once she found out she was pregnant and has not been treated with any medications since. Her fingerstick values on admission were in the 200s to 300s range. Her hemoglobin A1c today was 10.3%.   The patient reports that she was diagnosed with type 2 diabetes about 8 years ago. Her diabetes has been mostly managed by her family medicine doctor. She denies any history of retinopathy or nephropathy.  Her labs on admission showed normal platelet counts, normal kidney function, and normal liver function tests. She had an ultrasound today that showed a gestational sac that was consistent with a pregnancy at between 5 to 6 weeks. No fetal pole was able to be visualized today.  She denies any other significant past medical or surgical history.  The implications and management of diabetes in pregnancy was discussed in detail with the patient today. She was advised that women with uncontrolled diabetes in pregnancy may be at increased risk for miscarriage, congenital anomalies, an indicated preterm delivery, preeclampsia, fetal growth restriction, fetal macrosomia, and polyhydramnios.  The patient was advised that her ultrasound results today may indicate that she is too early in her pregnancy for visualization of the fetus or that she may have had a miscarriage. She will require another ultrasound in a few weeks to verify that a fetal heartbeat is present.  She was advised that our goals for her fingerstick values are fasting values of 90-95 or less and two-hour postprandials of 120 or less. In the first trimester of pregnancy, I would recommend that the patient be started on 0.7 to 0.8 units/kg of total insulin per day. The total insulin dosage  should be divided into a regimen of long-acting and short acting insulin. The patient is meeting with the diabetes coordinator now who will help with the management of her insulin dosage while she is an inpatient. The patient may be referred to our office once she is discharged to help with adjustments to her insulin dosage. The patient understands that she will most likely require increasing doses of insulin as her pregnancy progresses. She understands that getting her blood glucose values as close to the recommended range would provide her with the most optimal obstetrical outcome.  Due to diabetes in pregnancy, she should be followed with serial ultrasound exams. She should have another ultrasound performed in the few weeks in your office to confirm that a fetal pole is present. She should have another ultrasound performed at around 12 weeks for an early assessment of the fetal anatomy. She should have a cell free DNA test to screen for fetal aneuploidy at 10 to 12 weeks. She should have a detailed fetal anatomy scan scheduled at around 19 weeks.  A fetal echocardiogram should also be scheduled at around 22 weeks. She should then be followed with serial growth ultrasounds. Weekly fetal testing should be started at around 32 weeks.  The patient was advised that should the fetal growth appear normal and she maintains normal glucose control, that delivery at around 39 weeks is usually recommended. Should her glycemic control remain poor, delivery may be considered at around 37 weeks or earlier .  The patient was advised that the goal for this admission is to find the correct insulin regimen to  help her obtain better glycemic control. Hopefully, she may be discharged home in a few days.   At the end of the consultation, the patient stated that all of her questions have been answered to her complete satisfaction.   Thank you for referring this patient for Maternal-Fetal Medicine  consultation.  Recommendations:  -Start insulin therapy using short and long-acting insulin as recommended by the diabetes coordinator -Repeat ultrasound in 2 to 3 weeks to confirm a viable intrauterine pregnancy -Early fetal anatomy scan at 12 weeks -Cell free DNA test to screen for fetal aneuploidy at 12 weeks -Detailed fetal anatomy scan at around 19 weeks -Fetal echocardiogram around 22 weeks -Serial growth ultrasounds -Weekly fetal testing to start at 32 weeks -Delivery at 39 weeks if she maintains good glycemic control.  Delivery at 37 weeks or earlier should there be poor glycemic control or other complications -The patient may be sent to our office to help with the management of her insulin dosage/glycemic control once she is discharged home

## 2020-04-18 NOTE — Progress Notes (Addendum)
Inpatient Diabetes Program Recommendations  Diabetes Treatment Program Recommendations  ADA Standards of Care 2018 Diabetes in Pregnancy Target Glucose Ranges:  Fasting: 60 - 90 mg/dL Preprandial: 60 - 105 mg/dL 1 hr postprandial: Less than 114m/dL (from first bite of meal) 2 hr postprandial: Less than 120 mg/dL (from first bite of meal)    Lab Results  Component Value Date   GLUCAP 295 (H) 04/18/2020   HGBA1C >15.5 (H) 09/25/2019    Review of Glycemic Control Results for HHEDDA, CRUMBLEY(MRN [id number] as of 04/18/2020 09:36  Ref. Range 04/18/2020 08:59  Glucose-Capillary Latest Ref Range: 70 - 99 mg/dL 295 (H)   Diabetes history: Type 2 DM Outpatient Diabetes medications: Januvia 100 mg QD Current orders for Inpatient glycemic control: none  Inpatient Diabetes Program Recommendations:    Noted gestational age per chart review. Previous A1C 11.6%, indicating need for insulin.   Consider:   -NPH 15 units BID -Novolog 4 units TID (assuming patient is consuming >50% of meal) -Metformin 500 mg BID -Novolog 0-14 units TID & Hs  Of note, Januvia is class C drug in pregnancy indicating lack of data of use. Will not bring A1C down into controlled range regardless.   Will plan to see patient.   Addendum: Spoke with patient at length regarding outpatient diabetes management. Verified home medications, however was told to stop diabetes medications outpatient until follow up with OB at previous PCP appointment last week. Patient has been on insulin in the past, however discontinued following up as she felt like the insulin "didn't work to bring blood sugars down and it was expensive".  Reviewed patient's current A1c of 10.3%. Explained what a A1c is and what it measures. Also reviewed goal A1c with patient, importance of good glucose control @ home, and blood sugar goals. Reviewed patho of DM in pregnancy, need for insulin, role of pancreas, impact of poor glycemic control to  pregnancy, risk of malformations, need for increased insulin as pregnancy continues, need for follow up with endocrinology, survival skills, hypo vs hyperglycemia, differences between long acting vs short acting insulin, vascular change sand commorbidities.  Patient has a meter. Was checking only occasionally. Has supplies for outpatient use. Also, discussed Freestyle libre with patient, risks and benefits reviewed. Patient is interested. MD provided orders. Freestyle Libre applied [employer]  Patient admits to consuming large amounts of sugary beverages. Reviewed alternatives, plate method, carb counting and how to read nutritional labels. Dietitian consult placed. Educated patient and mother on insulin pen use at home. Reviewed contents of insulin flexpen starter kit. Reviewed all steps if insulin pen including attachment of needle, 2-unit air shot, dialing up dose, giving injection, removing needle, disposal of sharps, storage of unused insulin, disposal of insulin etc. Patient able to provide successful return demonstration. Also reviewed troubleshooting with insulin pen. MD to give patient Rxs for insulin pens and insulin pen needles.  Patient has no further questions at this time.  Discussed plan with RN, Dr FAnnamaria Boots MFM and Dr CGarwin Brothers Will continue to follow.    Thanks, LBronson Curb MSN, RNC-OB Diabetes Coordinator [phone]-5p)

## 2020-04-18 NOTE — H&P (Addendum)
Lisa Whitehead is a 24 y.o. female presenting for  Diabetes control. Sure LMP (+) SPT. Pt was told by her PCP to discontinue her oral  diabetes medication on Wednesday . Review of notes from PCP showed last HGbA1c( 12/2019) was 11.6%) OB History    Gravida  1   Para      Term      Preterm      AB      Living        SAB      TAB      Ectopic      Multiple      Live Births             Past Medical History:  Diagnosis Date  . Diabetes ([facility]) 10/01/2019  . Diabetes mellitus without complication ([facility])   . Hyperlipemia 10/01/2019   Surgery: none Family History: M, F diabetes Social History:  reports that she has never smoked. She has never used smokeless tobacco. She reports that she does not drink alcohol and does not use drugs.     Maternal Diabetes: Yes:  Diabetes Type:  Pre-pregnancy dx age 63  Review of Systems  All other systems reviewed and are negative. SH: single, nonsmoker works at [employer] History:  Prenatal Complications - Diabetes: type 2.      Blood pressure (!) 122/96, pulse 100, temperature 98.1 F (36.7 C), temperature source Oral, resp. rate 18, last menstrual period 03/06/2020, SpO2 100 %. Exam Physical Exam Constitutional:      Appearance: Normal appearance.  Eyes:     Pupils: Pupils are equal, round, and reactive to light.  Cardiovascular:     Rate and Rhythm: Regular rhythm.     Heart sounds: Normal heart sounds.  Pulmonary:     Breath sounds: Normal breath sounds.  Abdominal:     General: Abdomen is flat.  Genitourinary:    Comments: deferred Musculoskeletal:     Cervical back: Neck supple.  Neurological:     Mental Status: She is alert.     Prenatal labs: ABO, Rh: --/--/O POS (11/22 0849) Antibody: NEG (11/22 0849) Rubella:  pending RPR: NON REACTIVE (11/22 0849)  HBsAg: NON REACTIVE (11/22 0849)  HIV: Non Reactive (11/22 0849)  GBS:   n/a US OB Comp Less 14 Wks  Result Date: 04/18/2020 CLINICAL DATA:   Uncontrolled diabetes EXAM: OBSTETRIC <14 WK [place] AND TRANSVAGINAL OB US TECHNIQUE: Both transabdominal and transvaginal ultrasound examinations were performed for complete evaluation of the gestation as well as the maternal uterus, adnexal regions, and pelvic cul-de-sac. Transvaginal technique was performed to assess early pregnancy. COMPARISON:  None. FINDINGS: Intrauterine gestational sac: Single Yolk sac:  Visualized Embryo:  Not visualized Cardiac Activity: Not visualized Heart Rate:   bpm MSD: 10.9 mm   5 w   5 d CRL:    mm    w    d                  [place] EDC: Subchorionic hemorrhage:  None visualized. Maternal uterus/adnexae: No adnexal mass.  Trace free fluid. IMPRESSION: Early intrauterine gestational sac with yolk sac. No fetal pole currently. This could be followed with repeat ultrasound in 14 days to ensure expected progression. No acute maternal findings. Electronically Signed   By: Charlett Nose M.D.   On: 04/18/2020 10:14   US OB Transvaginal  Result Date: 04/18/2020 CLINICAL DATA:  Uncontrolled diabetes EXAM: OBSTETRIC <14 WK [place] AND TRANSVAGINAL OB US  TECHNIQUE: Both transabdominal and transvaginal ultrasound examinations were performed for complete evaluation of the gestation as well as the maternal uterus, adnexal regions, and pelvic cul-de-sac. Transvaginal technique was performed to assess early pregnancy. COMPARISON:  None. FINDINGS: Intrauterine gestational sac: Single Yolk sac:  Visualized Embryo:  Not visualized Cardiac Activity: Not visualized Heart Rate:   bpm MSD: 10.9 mm   5 w   5 d CRL:    mm    w    d                  [place] EDC: Subchorionic hemorrhage:  None visualized. Maternal uterus/adnexae: No adnexal mass.  Trace free fluid. IMPRESSION: Early intrauterine gestational sac with yolk sac. No fetal pole currently. This could be followed with repeat ultrasound in 14 days to ensure expected progression. No acute maternal findings. Electronically Signed   By: Charlett Nose M.D.   On:  04/18/2020 10:14   Assessment/Plan: Uncontrolled diabetes  Early pregnancy P) admit. MFM consult. Diabetes consult. Prenatal labs. Glucose control. Initiate insulin regimen. Pelvic sonogram( done)  Renaud Celli A Claudett Bayly 04/18/2020, 1:13 PM

## 2020-04-19 DIAGNOSIS — O24111 Pre-existing diabetes mellitus, type 2, in pregnancy, first trimester: Secondary | ICD-10-CM | POA: Diagnosis not present

## 2020-04-19 LAB — GLUCOSE, CAPILLARY
Glucose-Capillary: 114 mg/dL — ABNORMAL HIGH (ref 70–99)
Glucose-Capillary: 154 mg/dL — ABNORMAL HIGH (ref 70–99)
Glucose-Capillary: 79 mg/dL (ref 70–99)
Glucose-Capillary: 144 mg/dL — ABNORMAL HIGH (ref 70–99)

## 2020-04-19 LAB — RUBELLA SCREEN: Rubella: 2.58 index (ref >0.99)

## 2020-04-19 LAB — VARICELLA ZOSTER ANTIBODY, IGG: Varicella IgG: 2363 index (ref >165)

## 2020-04-19 LAB — CULTURE, OB URINE

## 2020-04-19 NOTE — Progress Notes (Signed)
HD #  2 S: feels well. Express understanding of need for insulin therapy and ultimately seeing endocrinologist. Reviewed MFM plan with pt O: BP 109/72 (BP Location: Left Arm)   Pulse (!) 116   Temp 98.5 F (36.9 C) (Oral)   Resp 19   Ht 5' (1.524 m)   Wt 61.2 kg   LMP 03/06/2020   SpO2 99%   BMI 26.37 kg/m  WDWN BF in NAD HEENT , anicteric sclera, pink conjunctiva Oropharynx neg Lungs clear to A Abdomen: soft nontender Extr no edema  IMP: Class B DM on insulin/metformin doing better IUGS P) continue adjusting med Case mgmt consult for medication assistance given pt may have problem with cost of medications( insulin especially) Which has been documented in prior records for noncompliance and thus poor control of DM MFM note appreciated      CBG Orders  (From admission, onward)           Start     Ordered   04/19/20 0800  CBG monitoring (Fasting)  (Known Diabetes)  Every morning      04/18/20 1109   04/18/20 1108  CBG monitoring  (Known Diabetes)  4 times daily after meals & at bedtime     "And" Linked Group Details   04/18/20 1109   04/18/20 0936  POCT CBG monitoring  Once       Comments: Fasting and 2 hours postprandial   04/18/20 0936

## 2020-04-19 NOTE — TOC Benefit Eligibility Note (Signed)
Transition of Care [facility]) Benefit Eligibility Note    Patient Details  Name: Lisa Whitehead MRN: [id number] Date of Birth: April 30, 1996   Medication/Dose: MELFORMIN  (46286 ) 500 MG BID  Covered?: Yes  Tier:  (TIER-1 DRUG)  Prescription Coverage Preferred Pharmacy: [facility]  Spoke with Person/Company/Phone Number:: JAMES @ [employer] # [phone]  Co-Pay: $7.17  Prior Approval: No  Deductible: Unmet  Additional Notes: HUMULIN  100 UNITS  COVER-YES   CO-PAY-$10.00  TIER- 1 DRUG  P/A-NO  and  HUMULOG 100 UNITS  COVER- YES  CO-PAY- $10.00  P/A-NO    Mardene Sayer Phone Number: 04/19/2020, 5:58 PM

## 2020-04-19 NOTE — Progress Notes (Signed)
I reviewed and concur with student's documentation.  

## 2020-04-19 NOTE — Care Management Note (Signed)
Case Management Note  Patient Details  Name: Lisa Whitehead MRN: [id number] Date of Birth: [birth year]  Subjective/Objective:                  Lisa Whitehead is a 24 y.o. female presenting for  Diabetes control  Action/Plan: Home with Insulin  In-House Referral:  Diabetes coordinator  Discharge planning Services  CM Consult   Additional Comments: Consult for CM due to cost of medication for patient.  CM spoke to patient regarding insulin.  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 Patient is not a candiate for the [facility] program through the [facility] department since patient has private insurance.  CM spoke to the diabetes Coordinator and she recommended that patient go home on Humulin N and Humalog. She explained to CM that she has CBG machine and knows how to use it.   CM had benefit check on specific coverage for those specific insulins. See below: from Mardene Sayer CMA in the [facility] dept.  on 04/19/20 at 1758.Marland Kitchen Patient Details  Name: Lisa Whitehead MRN: [id number] Date of Birth: 1996-04-13   Medication/Dose: MELFORMIN  (57322 ) 500 MG BID  Covered?: Yes  Tier:  (TIER-1 DRUG)  Prescription Coverage [facility]erred [facility]  Spoke with Person/Company/Phone Number:: JAMES @ [employer] # [phone]  Co-Pay: $7.17  Prior Approval: No  Deductible: Unmet  Additional Notes: HUMULIN  100 UNITS  COVER-YES   CO-PAY-$10.00  TIER- 1 DRUG  P/A-NO  and  HUMULOG 100 UNITS  COVER- YES  CO-PAY- $10.00  P/A-NO    Per discussion with patient the vials- are covered at 10$ each at [facility] (preferred [facility] per insurance)  but the insulin pens are $45.00 each.  Patient had done her own research also on her insurance app.  CM encouraged patient to discuss with physician in the am what patient and MD feels is best plans for patient and cost best financially for patient.  CM sent coupon for Humulin insulin to patient for monthly  savings and explained usage.  If patient is discharged with vials of insulin then patient will need prescription for syringes and instruction prior to discharge on usage.    Geoffery Lyons, RN 04/19/2020, 8:30 PM

## 2020-04-19 NOTE — Progress Notes (Signed)
Inpatient Diabetes Program Recommendations  Diabetes Treatment Program Recommendations  ADA Standards of Care 2018 Diabetes in Pregnancy Target Glucose Ranges:  Fasting: 60 - 90 mg/dL Preprandial: 60 - 591 mg/dL 1 hr postprandial: Less than 140mg /dL (from first bite of meal) 2 hr postprandial: Less than 120 mg/dL (from first bite of meal)    Lab Results  Component Value Date   GLUCAP 154 (H) 04/19/2020   HGBA1C 10.3 (H) 04/18/2020    Review of Glycemic Control Results for Lisa Whitehead, Lisa Whitehead (MRN Ferne Reus) as of 04/19/2020 13:51  Ref. Range 04/18/2020 21:42 04/19/2020 08:03 04/19/2020 11:32  Glucose-Capillary Latest Ref Range: 70 - 99 mg/dL 89 04/21/2020 (H) 357 (H)   Diabetes history: Type 2 DM Outpatient Diabetes medications: Januvia 100 mg QD Current orders for Inpatient glycemic control: Novolog 0-14 units TID, Novolog 4 units TID, NPH 15 units BID, Metformin 500 mg BID  Inpatient Diabetes Program Recommendations:  Glucose trends much improved. If post prandials exceed goals of 140 mg/dL, could consider increasing meal coverage to Novolog 5 units TID.   Spoke with patient again to reinforce concepts discussed yesterday. Patient feeling much better about insulin being effective on glucose trends. Reviewed current inpatient dosages, dosing schedule, the importance of not missing doses outpatient, when to call MD, preferred medications and to set up follow up with endocrinology.   At discharge consider:   -Metformin (#[id number]) 500 mg BID -Humulin N 017) 15 units BID -Humalog (#[id number]) 4-5 units TID (based on today's post prandial readings) - Insulin pen needles (#00923  Thanks, #[id number], MSN, RNC-OB Diabetes Coordinator [phone] (8a-5p)

## 2020-04-19 NOTE — Progress Notes (Signed)
  Nutrition Dx: Food and nutrition-related knowledge deficit r/t limited previous education aeb newly diagnosed GDM. Hx of DM II x 8 years per pt. A1C 10.3 %   Nutrition education consult for Carbohydrate Modified Gestational Diabetic Diet completed.  "Meal  plan for gestational diabetics" handout given to patient.  Basic concepts reviewed. Pt typically consumes 1-2 meals per day. Reinforced need to consume all meals and snacks. Discussed menu options that are quick. Stressed elimination of sugary beverages   Questions answered.  Patient verbalizes understanding. Pts mother is newly Dx diabetic and they plan to help each other with diet.  Elisabeth Cara M.Odis Luster LDN Neonatal Nutrition Support Specialist/RD III

## 2020-04-20 DIAGNOSIS — O24111 Pre-existing diabetes mellitus, type 2, in pregnancy, first trimester: Secondary | ICD-10-CM | POA: Diagnosis not present

## 2020-04-20 LAB — GLUCOSE, CAPILLARY
Glucose-Capillary: 76 mg/dL (ref 70–99)
Glucose-Capillary: 135 mg/dL — ABNORMAL HIGH (ref 70–99)

## 2020-04-20 LAB — HGB FRACTIONATION CASCADE

## 2020-04-20 LAB — HGB FRACTIONATION BY HPLC
Hgb A2: 1.5 % — ABNORMAL LOW (ref 1.8–3.2)
Hgb A: 97.4 % (ref 96.4–98.8)
Hgb C: 0 %
Hgb E: 0 %
Hgb F: 0 % (ref 0.0–2.0)
Hgb S: 0 %
Hgb Variant: 1.1 % — ABNORMAL HIGH

## 2020-04-20 MED ORDER — INSULIN LISPRO 100 UNIT/ML ~~LOC~~ SOLN: 5.0000 [IU] | Freq: Three times a day (TID) | SUBCUTANEOUS | 11 refills | Status: AC

## 2020-04-20 MED ORDER — INSULIN NPH (HUMAN) (ISOPHANE) 100 UNIT/ML ~~LOC~~ SUSP: 15.0000 [IU] | Freq: Two times a day (BID) | SUBCUTANEOUS | 11 refills | Status: AC

## 2020-04-20 MED ORDER — INSULIN SYRINGES (DISPOSABLE) U-100 0.3 ML MISC: 5.0000 [IU] | Freq: Three times a day (TID) | 11 refills | Status: AC

## 2020-04-20 MED ORDER — METFORMIN HCL 500 MG PO TABS: 500.0000 mg | ORAL_TABLET | Freq: Two times a day (BID) | ORAL | 11 refills | Status: AC

## 2020-04-20 MED ORDER — INSULIN LISPRO 100 UNIT/ML ~~LOC~~ SOLN: 5.0000 [IU] | Freq: Three times a day (TID) | SUBCUTANEOUS | 11 refills | Status: DC

## 2020-04-20 NOTE — Progress Notes (Addendum)
Results for [name], [name] (MRN [id number]) as of 04/20/2020 08:37  Ref. Range 04/19/2020 08:03 04/19/2020 11:32 04/19/2020 17:26 04/19/2020 20:42  Glucose-Capillary Latest Ref Range: 70 - 99 mg/dL 818 (H) 563 (H) 79 149 (H)   Results for [name], [name] (MRN [id number]) as of 04/20/2020 08:37  Ref. Range 04/20/2020 06:09  Glucose-Capillary Latest Ref Range: 70 - 99 mg/dL 76    Diabetes history:Type 2 DM  Outpatient DM Meds:Januvia 100 mg Daily  Current orders:Novolog 0-14 units TID     Novolog 4 units TID     NPH 15 units BID     Metformin 500 mg BID   Called by RN caring for pt today requesting discharge order numbers for vial and syringes for pt--Per RN (per [facility] team), insulin vials are covered for pt by her insurance   For discharge home, please consider the following:  NPH Insulin 15 units BID (Humulin N) (Order # [id number])  Humalog 5 units TID with meals (Order # [id number])  Metformin 500 mg BID (Order # [id number])  Insulin Syringes (Order # [id number])    --Will follow patient during hospitalization--  Ambrose Finland RN, MSN, CDE Diabetes Coordinator Inpatient Glycemic Control Team Team Pager: [phone] (8a-5p)

## 2020-04-20 NOTE — Progress Notes (Signed)
HD # 3  S: no complaint  O: BP 108/75 (BP Location: Left Arm)   Pulse (!) 108   Temp 98.4 F (36.9 C) (Oral)   Resp 18   Ht 5' (1.524 m)   Wt 61.2 kg   LMP 03/06/2020   SpO2 100%   BMI 26.37 kg/m  Lungs clear to A Cor RRR  abd soft nontender  extr. No edema or calf tenderness  CBG (last 3)  Recent Labs    04/19/20 1726 04/19/20 2042 04/20/20 0609  GLUCAP 79 144* 76   IMP: Class B DM Early pregnancy  P) D/c home  f/u with Dr Talmage Nap next week Scheduled OV with sonogram in 2 wk Script based on new cost noted

## 2020-04-20 NOTE — Progress Notes (Signed)
Use of insulin vials and syringes taught this morning at both administration times.  Pt has used insulin in the past, but has always had a pen.  With RN cueing, pt drew up Novolog and NPH into one syringe correctly.  Pt "taught back" to RN the order in which to draw up the two insulins (Novolog then NPH [clear then cloudy]) and importance of not mixing them during syringe filling.  Pt already know correct technique for SQ injection and demonstrated this to RN.  Pt also filled syringe and self administered Novolog for correction of high blood sugar 2 hours after breakfast.  Pt discharged to home with her mother.  Condition stable.  Pt home with Freestyle Libre device as arranged by Diabetes Coordinator. Pt ambulated to car with D. Gainey-Lloyd, NT.  No equipment other that [employer] ordered for home at discharge.

## 2020-04-20 NOTE — Discharge Summary (Signed)
Physician Discharge Summary  Patient ID: Lisa Whitehead MRN: [id number] DOB/AGE: 08/09/1995 24 y.o.  Admit date: 04/18/2020 Discharge date: 04/20/2020  Admission Diagnoses:  uncontrolled  Class B DM, early pregnancy  Discharge Diagnoses: Class B DM , early pregnancy Active Problems:   Pre-existing diabetes mellitus affecting pregnancy in first trimester, antepartum   Discharged Condition: stable  Hospital Course: pt was admitted to antepartum unit for mgmt of uncontrolled pre-existing DM. Pt underwent sonogram for dating of pregnancy. Sono showed early gestational sac @ 5 wk 5 days. MFM consult and diabetes consult obtained. Pt was started on insulin and metformin with better BS noted. DM education done as well as Assess for affordability of med  Consults:  MFM , diabetes coordinator  Significant Diagnostic Studies: labs:  prenatal labs,  CBG, hgb A1c. and radiology: Ultrasound: pelvic  Treatments: insulin: Humalog and NPH  Discharge Exam: Blood pressure 108/75, pulse (!) 108, temperature 98.4 F (36.9 C), temperature source Oral, resp. rate 18, height 5' (1.524 m), weight 61.2 kg, last menstrual period 03/06/2020, SpO2 100 %. General appearance: alert, cooperative, and no distress Resp: clear to auscultation bilaterally Cardio: regular rate and rhythm, S1, S2 normal, no murmur, click, rub or gallop Pelvic: deferred Extremities: no edema, redness or tenderness in the calves or thighs Skin: Skin color, texture, turgor normal. No rashes or lesions  Disposition: Discharge disposition: 01-Home or Self Care       Discharge Instructions     Diet Carb Modified   Complete by: As directed    Increase activity slowly   Complete by: As directed       Allergies as of 04/20/2020   No Known Allergies      Medication List     STOP taking these medications    atorvastatin 10 MG tablet Commonly known as: LIPITOR   atorvastatin 20 MG tablet Commonly known as:  LIPITOR   cephALEXin 500 MG capsule Commonly known as: KEFLEX   ibuprofen 800 MG tablet Commonly known as: ADVIL   lidocaine 2 % solution Commonly known as: XYLOCAINE   medroxyPROGESTERone 150 MG/ML injection Commonly known as: DEPO-PROVERA   sitaGLIPtin 100 MG tablet Commonly known as: Januvia       TAKE these medications    insulin lispro 100 UNIT/ML injection Commonly known as: HumaLOG Inject 0.05 mLs (5 Units total) into the skin 3 (three) times daily with meals.   insulin lispro 100 UNIT/ML injection Commonly known as: HumaLOG Inject 0.05 mLs (5 Units total) into the skin 3 (three) times daily before meals.   insulin NPH Human 100 UNIT/ML injection Commonly known as: HumuLIN N Inject 0.15 mLs (15 Units total) into the skin 2 (two) times daily before a meal.   Insulin Syringes (Disposable) U-100 0.3 ML Misc 5 Units by Does not apply route in the morning, at noon, and at bedtime.   metFORMIN 500 MG tablet Commonly known as: GLUCOPHAGE Take 1 tablet (500 mg total) by mouth 2 (two) times daily with a meal. What changed: when to take this   prenatal multivitamin Tabs tablet Take 1 tablet by mouth daily at 12 noon.        Follow-up Information     Maxie Better, MD Follow up in 2 week(s).   Specialty: Obstetrics and Gynecology Contact information: [address] [place] [address] [phone]                 Signed: Serita Kyle 04/20/2020, 12:24 PM

## 2020-05-28 NOTE — L&D Delivery Note (Signed)
Operative Delivery Note At 7:34 AM a viable and healthy female was delivered via Vaginal, Vacuum [employer], operational).  Presentation: vertex; Position: Occiput,, Anterior; Station: +3.  Verbal consent: obtained from patient.  Risks and benefits discussed in detail.  Risks include, but are not limited to the risks of anesthesia, bleeding, infection, damage to maternal tissues, fetal cephalhematoma.  There is also the risk of inability to effect vaginal delivery of the head, or shoulder dystocia that cannot be resolved by established maneuvers, leading to the need for emergency cesarean section.  Indicated. Fetal heart rate deceleration/bradycardia APGAR: 7, 8; weight 5 lb 10.7 oz (2570 g).   Placenta status:  spontaneous intact not sent, .   Cord:   loop of cord next to chest with the following complications: .  Cord pH: n/a  Anesthesia:  epidural, local Instruments: mushroom vacuum Episiotomy: Median Lacerations: None Suture Repair: 3.0 chromic Est. Blood Loss (mL): 200  Mom to postpartum.  Baby to Couplet care / Skin to Skin.  Lisa Whitehead A Lisa Whitehead 11/22/2020, 9:15 AM

## 2020-06-16 LAB — OB RESULTS CONSOLE GC/CHLAMYDIA
Chlamydia: NEGATIVE
Gonorrhea: NEGATIVE

## 2020-08-08 ENCOUNTER — Encounter (HOSPITAL_COMMUNITY): Payer: Self-pay | Admitting: Emergency Medicine

## 2020-08-08 ENCOUNTER — Other Ambulatory Visit: Payer: Self-pay

## 2020-08-08 ENCOUNTER — Inpatient Hospital Stay (HOSPITAL_COMMUNITY)
Admission: AD | Admit: 2020-08-08 | Discharge: 2020-08-09 | Disposition: A | Discharge status: Home / Self Care | Payer: 59 | Attending: Obstetrics and Gynecology | Admitting: Obstetrics and Gynecology

## 2020-08-08 DIAGNOSIS — K219 Gastro-esophageal reflux disease without esophagitis: Secondary | ICD-10-CM

## 2020-08-08 DIAGNOSIS — R1013 Epigastric pain: Secondary | ICD-10-CM | POA: Insufficient documentation

## 2020-08-08 DIAGNOSIS — O24311 Unspecified pre-existing diabetes mellitus in pregnancy, first trimester: Secondary | ICD-10-CM

## 2020-08-08 DIAGNOSIS — Z79899 Other long term (current) drug therapy: Secondary | ICD-10-CM | POA: Insufficient documentation

## 2020-08-08 DIAGNOSIS — Z3A22 22 weeks gestation of pregnancy: Secondary | ICD-10-CM | POA: Insufficient documentation

## 2020-08-08 DIAGNOSIS — Z7984 Long term (current) use of oral hypoglycemic drugs: Secondary | ICD-10-CM | POA: Insufficient documentation

## 2020-08-08 DIAGNOSIS — Z794 Long term (current) use of insulin: Secondary | ICD-10-CM | POA: Insufficient documentation

## 2020-08-08 DIAGNOSIS — O26892 Other specified pregnancy related conditions, second trimester: Secondary | ICD-10-CM | POA: Insufficient documentation

## 2020-08-08 DIAGNOSIS — O24112 Pre-existing diabetes mellitus, type 2, in pregnancy, second trimester: Secondary | ICD-10-CM | POA: Insufficient documentation

## 2020-08-08 NOTE — ED Notes (Signed)
EDP evaluated patient at triage .

## 2020-08-08 NOTE — MAU Note (Signed)
Pt stated she has had a pain in her epigastric area that stared at 4:00 this morning called her doctor and was told take pepcid ( at 9pm) did not help. Hurts when she takes a deep breath in, yawns or burps.

## 2020-08-08 NOTE — ED Triage Notes (Signed)
Patient reports upper abdominal pain onset yesterday worse this evening , no emesis or diarrhea , she is [redacted] weeks pregnant G1P0 , denies vaginal bleeding . MAU notified for possible transfer .

## 2020-08-09 ENCOUNTER — Encounter (HOSPITAL_COMMUNITY): Payer: Self-pay | Admitting: Obstetrics and Gynecology

## 2020-08-09 DIAGNOSIS — O24112 Pre-existing diabetes mellitus, type 2, in pregnancy, second trimester: Secondary | ICD-10-CM | POA: Diagnosis not present

## 2020-08-09 DIAGNOSIS — Z3A22 22 weeks gestation of pregnancy: Secondary | ICD-10-CM | POA: Diagnosis not present

## 2020-08-09 DIAGNOSIS — R1013 Epigastric pain: Secondary | ICD-10-CM | POA: Diagnosis not present

## 2020-08-09 DIAGNOSIS — Z794 Long term (current) use of insulin: Secondary | ICD-10-CM | POA: Diagnosis not present

## 2020-08-09 DIAGNOSIS — O26892 Other specified pregnancy related conditions, second trimester: Secondary | ICD-10-CM | POA: Diagnosis present

## 2020-08-09 DIAGNOSIS — Z7984 Long term (current) use of oral hypoglycemic drugs: Secondary | ICD-10-CM | POA: Diagnosis not present

## 2020-08-09 DIAGNOSIS — Z79899 Other long term (current) drug therapy: Secondary | ICD-10-CM | POA: Diagnosis not present

## 2020-08-09 LAB — COMPREHENSIVE METABOLIC PANEL
ALT: 11 U/L (ref 0–44)
AST: 14 U/L — ABNORMAL LOW (ref 15–41)
Albumin: 2.9 g/dL — ABNORMAL LOW (ref 3.5–5.0)
Alkaline Phosphatase: 62 U/L (ref 38–126)
Anion gap: 9 (ref 5–15)
BUN: 5 mg/dL — ABNORMAL LOW (ref 6–20)
CO2: 22 mmol/L (ref 22–32)
Calcium: 8.9 mg/dL (ref 8.9–10.3)
Chloride: 101 mmol/L (ref 98–111)
Creatinine, Ser: 0.54 mg/dL (ref 0.44–1.00)
GFR, Estimated: >60 mL/min (ref >60)
Glucose, Bld: 99 mg/dL (ref 70–99)
Potassium: 3.4 mmol/L — ABNORMAL LOW (ref 3.5–5.1)
Sodium: 132 mmol/L — ABNORMAL LOW (ref 135–145)
Total Bilirubin: 0.9 mg/dL (ref 0.3–1.2)
Total Protein: 6.6 g/dL (ref 6.5–8.1)

## 2020-08-09 LAB — AMYLASE: Amylase: 36 U/L (ref 28–100)

## 2020-08-09 LAB — LIPASE, BLOOD: Lipase: 24 U/L (ref 11–51)

## 2020-08-09 MED ORDER — PANTOPRAZOLE SODIUM 40 MG PO TBEC: 40.0000 mg | DELAYED_RELEASE_TABLET | Freq: Every day | ORAL | 11 refills | Status: AC

## 2020-08-09 MED ORDER — ALUM & MAG HYDROXIDE-SIMETH 200-200-20 MG/5ML PO SUSP
30.0000 mL | Freq: Once | ORAL | Status: AC
  Administered 2020-08-09: 30 mL via ORAL
  Filled 2020-08-09: qty 30

## 2020-08-09 MED ORDER — DICYCLOMINE HCL 10 MG PO CAPS
10.0000 mg | ORAL_CAPSULE | Freq: Once | ORAL | Status: AC
  Administered 2020-08-09: 10 mg via ORAL
  Filled 2020-08-09: qty 1

## 2020-08-09 NOTE — Discharge Instructions (Signed)
Food Choices for Gastroesophageal Reflux Disease, Adult When you have gastroesophageal reflux disease (GERD), the foods you eat and your eating habits are very important. Choosing the right foods can help ease your discomfort. Think about working with a food expert (dietitian) to help you make good choices. What are tips for following this plan? Reading food labels  Look for foods that are low in saturated fat. Foods that may help with your symptoms include: ? Foods that have less than 5% of daily value (DV) of fat. ? Foods that have 0 grams of trans fat. Cooking  Do not fry your food.  Cook your food by baking, steaming, grilling, or broiling. These are all methods that do not need a lot of fat for cooking.  To add flavor, try to use herbs that are low in spice and acidity. Meal planning  Choose healthy foods that are low in fat, such as: ? Fruits and vegetables. ? Whole grains. ? Low-fat dairy products. ? Lean meats, fish, and poultry.  Eat small meals often instead of eating 3 large meals each day. Eat your meals slowly in a place where you are relaxed. Avoid bending over or lying down until 2-3 hours after eating.  Limit high-fat foods such as fatty meats or fried foods.  Limit your intake of fatty foods, such as oils, butter, and shortening.  Avoid the following as told by your doctor: ? Foods that cause symptoms. These may be different for different people. Keep a food diary to keep track of foods that cause symptoms. ? Alcohol. ? Drinking a lot of liquid with meals. ? Eating meals during the 2-3 hours before bed.   Lifestyle  Stay at a healthy weight. Ask your doctor what weight is healthy for you. If you need to lose weight, work with your doctor to do so safely.  Exercise for at least 30 minutes on 5 or more days each week, or as told by your doctor.  Wear loose-fitting clothes.  Do not smoke or use any products that contain nicotine or tobacco. If you need help  quitting, ask your doctor.  Sleep with the head of your bed higher than your feet. Use a wedge under the mattress or blocks under the bed frame to raise the head of the bed.  Chew sugar-free gum after meals. What foods should eat? Eat a healthy, well-balanced diet of fruits, vegetables, whole grains, low-fat dairy products, lean meats, fish, and poultry. Each person is different. Foods that may cause symptoms in one person may not cause any symptoms in another person. Work with your doctor to find foods that are safe for you. The items listed above may not be a complete list of what you can eat and drink. Contact a food expert for more options.   What foods should I avoid? Limiting some of these foods may help in managing the symptoms of GERD. Everyone is different. Talk with a food expert or your doctor to help you find the exact foods to avoid, if any. Fruits Any fruits prepared with added fat. Any fruits that cause symptoms. For some people, this may include citrus fruits, such as oranges, grapefruit, pineapple, and lemons. Vegetables Deep-fried vegetables. French fries. Any vegetables prepared with added fat. Any vegetables that cause symptoms. For some people, this may include tomatoes and tomato products, chili peppers, onions and garlic, and horseradish. Grains Pastries or quick breads with added fat. Meats and other proteins High-fat meats, such as fatty beef or pork,   hot dogs, ribs, ham, sausage, salami, and bacon. Fried meat or protein, including fried fish and fried chicken. Nuts and nut butters, in large amounts. Dairy Whole milk and chocolate milk. Sour cream. Cream. Ice cream. Cream cheese. Milkshakes. Fats and oils Butter. Margarine. Shortening. Ghee. Beverages Coffee and tea, with or without caffeine. Carbonated beverages. Sodas. Energy drinks. Fruit juice made with acidic fruits, such as orange or grapefruit. Tomato juice. Alcoholic drinks. Sweets and desserts Chocolate and  cocoa. Donuts. Seasonings and condiments Pepper. Peppermint and spearmint. Added salt. Any condiments, herbs, or seasonings that cause symptoms. For some people, this may include curry, hot sauce, or vinegar-based salad dressings. The items listed above may not be a complete list of what you should not eat and drink. Contact a food expert for more options. Questions to ask your doctor Diet and lifestyle changes are often the first steps that are taken to manage symptoms of GERD. If diet and lifestyle changes do not help, talk with your doctor about taking medicines. Where to find more information  International Foundation for Gastrointestinal Disorders: aboutgerd.org Summary  When you have GERD, food and lifestyle choices are very important in easing your symptoms.  Eat small meals often instead of 3 large meals a day. Eat your meals slowly and in a place where you are relaxed.  Avoid bending over or lying down until 2-3 hours after eating.  Limit high-fat foods such as fatty meats or fried foods. This information is not intended to replace advice given to you by your health care provider. Make sure you discuss any questions you have with your health care provider. Document Revised: 11/23/2019 Document Reviewed: 11/23/2019 Elsevier Patient Education  2021 Elsevier Inc.  

## 2020-08-09 NOTE — MAU Provider Note (Signed)
History     Chief Complaint  Patient presents with   22 weeks Preg/Abdominal Pain    25 yo G1P0 BF @ 22 2/[redacted] wk gestation presents with  one day hx of epigastric pain. Denies SOB, n/v. PNC complicated  By Class B DM. Pain was not radiating to back. Pt reports taking pepcid w/o relief  OB History     Gravida  1   Para      Term      Preterm      AB      Living         SAB      IAB      Ectopic      Multiple      Live Births              Past Medical History:  Diagnosis Date   Diabetes (HCC) 10/01/2019   Diabetes mellitus without complication (HCC)    Hyperlipemia 10/01/2019    History reviewed. No pertinent surgical history.  No family history on file.  Social History   Tobacco Use   Smoking status: Never Smoker   Smokeless tobacco: Never Used  Substance Use Topics   Alcohol use: No   Drug use: No    Allergies: No Known Allergies  Medications Prior to Admission  Medication Sig Dispense Refill Last Dose   famotidine (PEPCID) 10 MG tablet Take 10 mg by mouth 2 (two) times daily.    at 2100   Prenatal Vit-Fe Fumarate-FA (PRENATAL MULTIVITAMIN) TABS tablet Take 1 tablet by mouth daily at 12 noon.   08/08/2020 at Unknown time   insulin lispro (HUMALOG) 100 UNIT/ML injection Inject 0.05 mLs (5 Units total) into the skin 3 (three) times daily with meals. 10 mL 11    insulin lispro (HUMALOG) 100 UNIT/ML injection Inject 0.05 mLs (5 Units total) into the skin 3 (three) times daily before meals. 10 mL 11    insulin NPH Human (HUMULIN N) 100 UNIT/ML injection Inject 0.15 mLs (15 Units total) into the skin 2 (two) times daily before a meal. 10 mL 11    Insulin Syringes, Disposable, U-100 0.3 ML MISC 5 Units by Does not apply route in the morning, at noon, and at bedtime. 1 each 11    metFORMIN (GLUCOPHAGE) 500 MG tablet Take 1 tablet (500 mg total) by mouth 2 (two) times daily with a meal. 60 tablet 11      Physical Exam   Blood pressure 115/79, pulse (!)  122, temperature 98.2 F (36.8 C), temperature source Oral, resp. rate 16, height 5' (1.524 m), weight 74 kg, last menstrual period 03/06/2020, SpO2 100 %.  General appearance: alert, cooperative and no distress Lungs: clear to auscultation bilaterally Heart: regular rate and rhythm, S1, S2 normal, no murmur, click, rub or gallop Abdomen: soft, non-tender; bowel sounds normal; no masses,  no organomegaly and gravid non tender Extremities: extremities normal, atraumatic, no cyanosis or edema ED Course   epigastric pain IUP @ 22 Class B DM  P) Gi cocktail. CMP, amylase, lipase MDM  Addendum Nl amylase, lipase CMP Latest Ref Rng & Units 08/09/2020 04/18/2020 09/25/2019  Glucose 70 - 99 mg/dL 99 676(H) 209(O)  BUN 6 - 20 mg/dL 5(L) 6 5(L)  Creatinine 0.44 - 1.00 mg/dL 7.09 6.28 3.66  Sodium 135 - 145 mmol/L 132(L) 132(L) 133(L)  Potassium 3.5 - 5.1 mmol/L 3.4(L) 3.5 3.8  Chloride 98 - 111 mmol/L 101 98 94(L)  CO2 22 - 32  mmol/L 22 22 23   Calcium 8.9 - 10.3 mg/dL 8.9 9.2 9.5  Total Protein 6.5 - 8.1 g/dL 6.6 7.4 7.4  Total Bilirubin 0.3 - 1.2 mg/dL 0.9 1.0 0.9  Alkaline Phos 38 - 126 U/L 62 89 147(H)  AST 15 - 41 U/L 14(L) 17 14  ALT 0 - 44 U/L 11 21 19    No evidence of other cause for epigastric pain  other than GERD D/c home Script for protonix sent to pharm    , MD 1:23 AM 08/09/2020

## 2020-10-05 ENCOUNTER — Other Ambulatory Visit: Payer: Self-pay | Admitting: Obstetrics and Gynecology

## 2020-10-05 DIAGNOSIS — Z363 Encounter for antenatal screening for malformations: Secondary | ICD-10-CM

## 2020-10-26 ENCOUNTER — Ambulatory Visit: Payer: 59 | Attending: Obstetrics and Gynecology

## 2020-10-26 ENCOUNTER — Ambulatory Visit (HOSPITAL_BASED_OUTPATIENT_CLINIC_OR_DEPARTMENT_OTHER): Payer: 59 | Admitting: Obstetrics and Gynecology

## 2020-10-26 ENCOUNTER — Ambulatory Visit: Payer: 59 | Admitting: *Deleted

## 2020-10-26 ENCOUNTER — Encounter: Payer: Self-pay | Admitting: *Deleted

## 2020-10-26 ENCOUNTER — Other Ambulatory Visit: Payer: Self-pay

## 2020-10-26 VITALS — BP 130/84 | HR 92

## 2020-10-26 DIAGNOSIS — Z794 Long term (current) use of insulin: Secondary | ICD-10-CM

## 2020-10-26 DIAGNOSIS — O24113 Pre-existing diabetes mellitus, type 2, in pregnancy, third trimester: Secondary | ICD-10-CM | POA: Diagnosis not present

## 2020-10-26 DIAGNOSIS — O288 Other abnormal findings on antenatal screening of mother: Secondary | ICD-10-CM | POA: Diagnosis not present

## 2020-10-26 DIAGNOSIS — Z3A33 33 weeks gestation of pregnancy: Secondary | ICD-10-CM

## 2020-10-26 DIAGNOSIS — O289 Unspecified abnormal findings on antenatal screening of mother: Secondary | ICD-10-CM

## 2020-10-26 DIAGNOSIS — Z363 Encounter for antenatal screening for malformations: Secondary | ICD-10-CM | POA: Insufficient documentation

## 2020-10-26 NOTE — Progress Notes (Signed)
Maternal-Fetal Medicine  Name: Lisa Whitehead DOB: 1996-05-02 MRN: [id number] Referring Provide: Maxie Better, MD  I had the pleasure of seeing Ms. [facility] at [facility].  She has type 2 diabetes and takes insulin for control.  Past medical history significant for type 2 diabetes diagnosed 8 years ago.  Patient takes Humulin 12 to 15 units in the morning and 12 to 15 units at night.  She takes Humalog 3 to 5 units with each meal.  She also takes metformin.  She reports her fasting levels are mostly below 95 mg/DL but occasionally over 163 mg/DL.  Postprandial levels or reportedly below 130 mg/DL.  Patient had ophthalmology examination last month and no proliferative retinopathy was diagnosed.  She does not have renal or neurological complications.  No history of hypertension or thyroid disorder or sickle cell trait.  Past surgical history: Wisdom tooth surgery. Medications: Insulin, low-dose aspirin, metformin, prenatal vitamins, pantoprazole 40 mg daily. Allergies: No known drug allergies. Social history: Denies tobacco or drug or alcohol use. Family history: Both parents have diabetes and hypertension.  She has 5 sisters and all are in good health.  No history of venous thromboembolism in the family. GYN history: No history of abnormal Pap smears or cervical surgeries.  No history of breast disease.  Her previous menstrual cycles were reportedly regular.  Prenatal course has been uneventful so far.  She had low risk for fetal aneuploidies on serum screening.  She had normal fetal echocardiography.  We performed a fetal anatomical survey.  Fetal growth is appropriate for gestational age.  Amniotic fluid is normal and good fetal activity seen.  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  No other cardiac anomalies seen.  No other obvious fetal structural defects are seen. Antenatal testing is reassuring.  BPP 8/8.  Our concerns include: Ventricular  septal defect (VSD) -Small muscular VSDs usually spontaneously close with advancing gestation or after birth.  We do not expect any neonatal problems.  Diabetes is a possible cause of cardiac anomaly.  Patient reports she had poor control at conception.  Other causes of VSD include chromosomal anomalies including Down syndrome.  She was reassured that she had low risk for fetal aneuploidies on serum screening.  I counseled her that only amniocentesis will give a definitive result on the fetal karyotype.  Patient opted not to have amniocentesis. I discussed referral for fetal echocardiography.  Alternatively, she can have echocardiography of the newborn. Patient would like to wait until after birth.  Diabetes in pregnancy -I discussed the importance of good diabetic control to prevent adverse fetal or neonatal outcomes.  Complications include fetal macrosomia and shoulder dystocia leading to neurological injuries at birth.  Poorly controlled diabetes can also lead to prolonged NICU stay with complications. -We discussed the parameters of normal blood glucose levels and I encouraged her to increase her insulin doses as needed. -We discussed timing of delivery. In well-controlled diabetes, she can be delivered at [redacted] weeks gestation.  If diabetes control is suboptimal, delivery should be considered at 44- or 38-weeks gestation. -I discussed antenatal testing protocol in the management of diabetes in pregnancy.  Recommendations: -Weekly BPP at your office. -Adjust insulin as needed. -Delivery at 39 weeks' gestation if diabetes is well controlled, or at 37 or 38 weeks if diabetes is not well controlled. -Echocardiography of the newborn to rule out VSD.   Thank you for consultation. If you have any questions, please contact me at the [facility]. Consultation including face-to-face counseling 45 minutes.

## 2020-11-17 LAB — OB RESULTS CONSOLE GBS: GBS: POSITIVE

## 2020-11-20 ENCOUNTER — Other Ambulatory Visit: Payer: Self-pay

## 2020-11-20 ENCOUNTER — Inpatient Hospital Stay (HOSPITAL_COMMUNITY)
Admission: AD | Admit: 2020-11-20 | Discharge: 2020-11-24 | DRG: 807 | Disposition: A | Discharge status: Home / Self Care | Payer: 59 | Attending: Obstetrics and Gynecology | Admitting: Obstetrics and Gynecology

## 2020-11-20 ENCOUNTER — Encounter (HOSPITAL_COMMUNITY): Payer: Self-pay | Admitting: Obstetrics and Gynecology

## 2020-11-20 ENCOUNTER — Inpatient Hospital Stay (HOSPITAL_BASED_OUTPATIENT_CLINIC_OR_DEPARTMENT_OTHER): Payer: 59

## 2020-11-20 DIAGNOSIS — Z3A37 37 weeks gestation of pregnancy: Secondary | ICD-10-CM | POA: Diagnosis not present

## 2020-11-20 DIAGNOSIS — O99824 Streptococcus B carrier state complicating childbirth: Secondary | ICD-10-CM | POA: Diagnosis present

## 2020-11-20 DIAGNOSIS — O9902 Anemia complicating childbirth: Secondary | ICD-10-CM | POA: Diagnosis present

## 2020-11-20 DIAGNOSIS — Z794 Long term (current) use of insulin: Secondary | ICD-10-CM

## 2020-11-20 DIAGNOSIS — O1493 Unspecified pre-eclampsia, third trimester: Secondary | ICD-10-CM

## 2020-11-20 DIAGNOSIS — Z7984 Long term (current) use of oral hypoglycemic drugs: Secondary | ICD-10-CM | POA: Diagnosis not present

## 2020-11-20 DIAGNOSIS — O1404 Mild to moderate pre-eclampsia, complicating childbirth: Secondary | ICD-10-CM | POA: Diagnosis present

## 2020-11-20 DIAGNOSIS — O2412 Pre-existing diabetes mellitus, type 2, in childbirth: Secondary | ICD-10-CM | POA: Diagnosis present

## 2020-11-20 DIAGNOSIS — E119 Type 2 diabetes mellitus without complications: Secondary | ICD-10-CM

## 2020-11-20 DIAGNOSIS — Z363 Encounter for antenatal screening for malformations: Secondary | ICD-10-CM | POA: Diagnosis not present

## 2020-11-20 DIAGNOSIS — Z20822 Contact with and (suspected) exposure to covid-19: Secondary | ICD-10-CM | POA: Diagnosis present

## 2020-11-20 DIAGNOSIS — R109 Unspecified abdominal pain: Secondary | ICD-10-CM

## 2020-11-20 DIAGNOSIS — O24113 Pre-existing diabetes mellitus, type 2, in pregnancy, third trimester: Secondary | ICD-10-CM | POA: Diagnosis not present

## 2020-11-20 DIAGNOSIS — O26893 Other specified pregnancy related conditions, third trimester: Secondary | ICD-10-CM

## 2020-11-20 LAB — URINALYSIS, ROUTINE W REFLEX MICROSCOPIC
Bilirubin Urine: NEGATIVE
Glucose, UA: NEGATIVE mg/dL
Hgb urine dipstick: NEGATIVE
Ketones, ur: NEGATIVE mg/dL
Nitrite: NEGATIVE
Protein, ur: 100 mg/dL — AB
Specific Gravity, Urine: 1.013 (ref 1.005–1.030)
WBC, UA: >50 WBC/hpf — ABNORMAL HIGH (ref 0–5)
pH: 6 (ref 5.0–8.0)

## 2020-11-20 LAB — COMPREHENSIVE METABOLIC PANEL
ALT: 12 U/L (ref 0–44)
AST: 17 U/L (ref 15–41)
Albumin: 2.7 g/dL — ABNORMAL LOW (ref 3.5–5.0)
Alkaline Phosphatase: 107 U/L (ref 38–126)
Anion gap: 10 (ref 5–15)
BUN: 7 mg/dL (ref 6–20)
CO2: 21 mmol/L — ABNORMAL LOW (ref 22–32)
Calcium: 8.9 mg/dL (ref 8.9–10.3)
Chloride: 105 mmol/L (ref 98–111)
Creatinine, Ser: 0.56 mg/dL (ref 0.44–1.00)
GFR, Estimated: >60 mL/min (ref >60)
Glucose, Bld: 85 mg/dL (ref 70–99)
Potassium: 3.7 mmol/L (ref 3.5–5.1)
Sodium: 136 mmol/L (ref 135–145)
Total Bilirubin: 0.4 mg/dL (ref 0.3–1.2)
Total Protein: 7.1 g/dL (ref 6.5–8.1)

## 2020-11-20 LAB — GLUCOSE, CAPILLARY
Glucose-Capillary: 80 mg/dL (ref 70–99)
Glucose-Capillary: 106 mg/dL — ABNORMAL HIGH (ref 70–99)
Glucose-Capillary: 84 mg/dL (ref 70–99)
Glucose-Capillary: 88 mg/dL (ref 70–99)

## 2020-11-20 LAB — CBC
HCT: 33.4 % — ABNORMAL LOW (ref 36.0–46.0)
HCT: 31.7 % — ABNORMAL LOW (ref 36.0–46.0)
Hemoglobin: 10.5 g/dL — ABNORMAL LOW (ref 12.0–15.0)
Hemoglobin: 10.4 g/dL — ABNORMAL LOW (ref 12.0–15.0)
MCH: 25.2 pg — ABNORMAL LOW (ref 26.0–34.0)
MCH: 25.7 pg — ABNORMAL LOW (ref 26.0–34.0)
MCHC: 31.4 g/dL (ref 30.0–36.0)
MCHC: 32.8 g/dL (ref 30.0–36.0)
MCV: 80.3 fL (ref 80.0–100.0)
MCV: 78.3 fL — ABNORMAL LOW (ref 80.0–100.0)
Platelets: 167 10*3/uL (ref 150–400)
Platelets: 181 10*3/uL (ref 150–400)
RBC: 4.16 MIL/uL (ref 3.87–5.11)
RBC: 4.05 MIL/uL (ref 3.87–5.11)
RDW: 14.5 % (ref 11.5–15.5)
RDW: 14.3 % (ref 11.5–15.5)
WBC: 10 10*3/uL (ref 4.0–10.5)
WBC: 11.9 10*3/uL — ABNORMAL HIGH (ref 4.0–10.5)
nRBC: 0 % (ref 0.0–0.2)
nRBC: 0 % (ref 0.0–0.2)

## 2020-11-20 LAB — RESP PANEL BY RT-PCR (FLU A&B, COVID) ARPGX2
Influenza A by PCR: NEGATIVE
Influenza B by PCR: NEGATIVE
SARS Coronavirus 2 by RT PCR: NEGATIVE

## 2020-11-20 LAB — PROTEIN / CREATININE RATIO, URINE
Creatinine, Urine: 99.05 mg/dL
Protein Creatinine Ratio: 0.96 mg/mg{Cre} — ABNORMAL HIGH (ref 0.00–0.15)
Total Protein, Urine: 95 mg/dL

## 2020-11-20 LAB — TYPE AND SCREEN
ABO/RH(D): O POS
Antibody Screen: NEGATIVE

## 2020-11-20 MED ORDER — INSULIN ASPART 100 UNIT/ML IJ SOLN
4.0000 [IU] | Freq: Once | INTRAMUSCULAR | Status: AC
  Administered 2020-11-20: 13:00:00 4 [IU] via SUBCUTANEOUS

## 2020-11-20 MED ORDER — OXYCODONE-ACETAMINOPHEN 5-325 MG PO TABS: 2.0000 | ORAL_TABLET | ORAL | Status: DC | PRN

## 2020-11-20 MED ORDER — MAGNESIUM SULFATE 40 GM/1000ML IV SOLN
2.0000 g/h | INTRAVENOUS | Status: DC
  Administered 2020-11-20 – 2020-11-21 (×2): 2 g/h via INTRAVENOUS
  Filled 2020-11-20 (×2): qty 1000

## 2020-11-20 MED ORDER — INSULIN ASPART PROT & ASPART (70-30 MIX) 100 UNIT/ML ~~LOC~~ SUSP
4.0000 [IU] | Freq: Once | SUBCUTANEOUS | Status: DC
Start: 2020-11-20 — End: 2020-11-20

## 2020-11-20 MED ORDER — HYDRALAZINE HCL 20 MG/ML IJ SOLN: 10.0000 mg | INTRAMUSCULAR | Status: DC | PRN

## 2020-11-20 MED ORDER — EPHEDRINE 5 MG/ML INJ: 10.0000 mg | INTRAVENOUS | Status: DC | PRN

## 2020-11-20 MED ORDER — OXYTOCIN 10 UNIT/ML IJ SOLN: 10.0000 [IU] | Freq: Once | INTRAMUSCULAR | Status: DC

## 2020-11-20 MED ORDER — SOD CITRATE-CITRIC ACID 500-334 MG/5ML PO SOLN: 30.0000 mL | ORAL | Status: DC | PRN

## 2020-11-20 MED ORDER — MISOPROSTOL 50MCG HALF TABLET
50.0000 ug | ORAL_TABLET | ORAL | Status: DC
  Administered 2020-11-20 (×2): 50 ug via ORAL
  Filled 2020-11-20 (×3): qty 1

## 2020-11-20 MED ORDER — PENICILLIN G POTASSIUM 5000000 UNITS IJ SOLR
5.0000 10*6.[IU] | Freq: Once | INTRAMUSCULAR | Status: AC
  Administered 2020-11-20: 5 10*6.[IU] via INTRAVENOUS
  Filled 2020-11-20: qty 5

## 2020-11-20 MED ORDER — OXYTOCIN BOLUS FROM INFUSION
333.0000 mL | Freq: Once | INTRAVENOUS | Status: AC
  Administered 2020-11-22: 333 mL via INTRAVENOUS

## 2020-11-20 MED ORDER — PENICILLIN G POT IN DEXTROSE 60000 UNIT/ML IV SOLN
3.0000 10*6.[IU] | INTRAVENOUS | Status: DC
  Administered 2020-11-20 – 2020-11-22 (×10): 3 10*6.[IU] via INTRAVENOUS
  Filled 2020-11-20 (×10): qty 50

## 2020-11-20 MED ORDER — ONDANSETRON HCL 4 MG/2ML IJ SOLN
4.0000 mg | Freq: Four times a day (QID) | INTRAMUSCULAR | Status: DC | PRN
  Administered 2020-11-21 – 2020-11-22 (×2): 4 mg via INTRAVENOUS
  Filled 2020-11-20 (×2): qty 2

## 2020-11-20 MED ORDER — FENTANYL-BUPIVACAINE-NACL 0.5-0.125-0.9 MG/250ML-% EP SOLN
12.0000 mL/h | EPIDURAL | Status: DC | PRN
  Administered 2020-11-21: 12 mL/h via EPIDURAL
  Filled 2020-11-20: qty 250

## 2020-11-20 MED ORDER — ACETAMINOPHEN 325 MG PO TABS
650.0000 mg | ORAL_TABLET | ORAL | Status: DC | PRN
  Administered 2020-11-21: 650 mg via ORAL
  Filled 2020-11-20: qty 2

## 2020-11-20 MED ORDER — LACTATED RINGERS IV SOLN: 500.0000 mL | INTRAVENOUS | Status: DC | PRN

## 2020-11-20 MED ORDER — LABETALOL HCL 5 MG/ML IV SOLN: 80.0000 mg | INTRAVENOUS | Status: DC | PRN

## 2020-11-20 MED ORDER — LACTATED RINGERS IV SOLN: 500.0000 mL | Freq: Once | INTRAVENOUS | Status: DC

## 2020-11-20 MED ORDER — LABETALOL HCL 5 MG/ML IV SOLN: 40.0000 mg | INTRAVENOUS | Status: DC | PRN

## 2020-11-20 MED ORDER — OXYTOCIN-SODIUM CHLORIDE 30-0.9 UT/500ML-% IV SOLN
1.0000 m[IU]/min | INTRAVENOUS | Status: DC
  Administered 2020-11-20: 2 m[IU]/min via INTRAVENOUS
  Administered 2020-11-21: 4 m[IU]/min via INTRAVENOUS
  Filled 2020-11-20 (×2): qty 500

## 2020-11-20 MED ORDER — TERBUTALINE SULFATE 1 MG/ML IJ SOLN: 0.2500 mg | Freq: Once | INTRAMUSCULAR | Status: DC | PRN

## 2020-11-20 MED ORDER — LABETALOL HCL 5 MG/ML IV SOLN: 20.0000 mg | INTRAVENOUS | Status: DC | PRN

## 2020-11-20 MED ORDER — OXYCODONE-ACETAMINOPHEN 5-325 MG PO TABS
1.0000 | ORAL_TABLET | ORAL | Status: DC | PRN
Start: 2020-11-20 — End: 2020-11-22

## 2020-11-20 MED ORDER — MAGNESIUM SULFATE BOLUS VIA INFUSION
4.0000 g | Freq: Once | INTRAVENOUS | Status: AC
  Administered 2020-11-20: 4 g via INTRAVENOUS
  Filled 2020-11-20: qty 1000

## 2020-11-20 MED ORDER — PHENYLEPHRINE 40 MCG/ML (10ML) SYRINGE FOR IV PUSH (FOR BLOOD PRESSURE SUPPORT): 80.0000 ug | PREFILLED_SYRINGE | INTRAVENOUS | Status: DC | PRN

## 2020-11-20 MED ORDER — LACTATED RINGERS IV SOLN: INTRAVENOUS | Status: DC

## 2020-11-20 MED ORDER — DIPHENHYDRAMINE HCL 50 MG/ML IJ SOLN: 12.5000 mg | INTRAMUSCULAR | Status: DC | PRN

## 2020-11-20 MED ORDER — SODIUM CHLORIDE 0.9 % IV SOLN
8.0000 mg | Freq: Once | INTRAVENOUS | Status: AC
  Administered 2020-11-20: 8 mg via INTRAVENOUS
  Filled 2020-11-20: qty 4

## 2020-11-20 MED ORDER — OXYTOCIN-SODIUM CHLORIDE 30-0.9 UT/500ML-% IV SOLN: 2.5000 [IU]/h | INTRAVENOUS | Status: DC

## 2020-11-20 MED ORDER — LIDOCAINE HCL (PF) 1 % IJ SOLN
30.0000 mL | INTRAMUSCULAR | Status: AC | PRN
  Administered 2020-11-22: 30 mL via SUBCUTANEOUS
  Filled 2020-11-20: qty 30

## 2020-11-20 NOTE — H&P (Signed)
Lisa Whitehead is a 25 y.o. female presenting @ 37 2/7 week for IOL After evaluation in [place] confirmed preeclampsia. PNC complicated by Class B DM, (+) GBS. OB History     Gravida  1   Para      Term      Preterm      AB      Living         SAB      IAB      Ectopic      Multiple      Live Births             Past Medical History:  Diagnosis Date   Diabetes (HCC) 10/01/2019   Diabetes mellitus without complication (HCC)    Hyperlipemia 10/01/2019   Past Surgical History:  Procedure Laterality Date   WISDOM TOOTH EXTRACTION  2018   Family History: family history includes Cancer in her maternal grandmother; Diabetes in her father, maternal grandmother, and mother; Hypertension in her father, maternal grandfather, and mother. Social History:  reports that she has never smoked. She has never used smokeless tobacco. She reports that she does not drink alcohol and does not use drugs.     Maternal Diabetes: Yes:  Diabetes Type:  Pre-pregnancy Genetic Screening: Normal Maternal Ultrasounds/Referrals: Other: Fetal Ultrasounds or other Referrals:  Fetal echo, Referred to Materal Fetal Medicine  MFM sono suggest muscular VSD. Nl eftal echo Maternal Substance Abuse:  No Significant Maternal Medications:  Meds include: Other:  Significant Maternal Lab Results:  Other: metformin, insulin Other Comments:   DM diagnosis at onset of pregnanc  Review of Systems  All other systems reviewed and are negative. History Dilation: 1 Effacement (%): 60 Station: -2 Exam by:: Dr. Cherly Hensen Blood pressure (!) 140/93, pulse 90, temperature 97.8 F (36.6 C), temperature source Oral, resp. rate 15, last menstrual period 03/06/2020, SpO2 100 %. Maternal Exam:  Introitus: Normal vulva.  Physical Exam Constitutional:      Appearance: Normal appearance.  HENT:     Head: Atraumatic.  Eyes:     Extraocular Movements: Extraocular movements intact.  Cardiovascular:     Rate and  Rhythm: Regular rhythm.     Heart sounds: Normal heart sounds.  Pulmonary:     Breath sounds: Normal breath sounds.  Abdominal:     Palpations: Abdomen is soft.  Genitourinary:    General: Normal vulva.  Musculoskeletal:     Cervical back: Neck supple.  Skin:    General: Skin is warm and dry.  Neurological:     Mental Status: She is oriented to person, place, and time.  Psychiatric:        Mood and Affect: Mood normal.        Behavior: Behavior normal.    Prenatal labs: ABO, Rh: --/--/O POS (06/26 1023) Antibody: NEG (06/26 1023) Rubella: 2.58 (11/22 0849) RPR: NON REACTIVE (11/22 0849)  HBsAg: NON REACTIVE (11/22 0849)  HIV: Non Reactive (11/22 0849)  GBS:   positive  Assessment/Plan: Preeclampsia  Class B DM GBS cx(+)  IUP @ 37 wk P) admit. Magnesium sulfate. Cytotec for cervical ripening. CBG q 2 hrs. IV PCN.     Ark Agrusa A Jahzir Strohmeier 11/20/2020, 12:30 PM

## 2020-11-20 NOTE — MAU Provider Note (Signed)
History    25 yo G1P0 BF @ 37 wk with Class B DM  on insulin here for antepartum testing due to proteinuria in the office.( Elevated PCR)  NL BP. Pt denies h/a, visual changes or epigastric pain. No leg swelling. BS mgmt per endocrinologist   OB History     Gravida  1   Para      Term      Preterm      AB      Living         SAB      IAB      Ectopic      Multiple      Live Births              Past Medical History:  Diagnosis Date   Diabetes (HCC) 10/01/2019   Diabetes mellitus without complication (HCC)    Hyperlipemia 10/01/2019    Past Surgical History:  Procedure Laterality Date   WISDOM TOOTH EXTRACTION  2018    Family History  Problem Relation Age of Onset   Diabetes Mother    Hypertension Mother    Diabetes Father    Hypertension Father    Cancer Maternal Grandmother    Diabetes Maternal Grandmother    Hypertension Maternal Grandfather     Social History   Tobacco Use   Smoking status: Never   Smokeless tobacco: Never  Vaping Use   Vaping Use: Never used  Substance Use Topics   Alcohol use: No   Drug use: No    Allergies: No Known Allergies  Medications Prior to Admission  Medication Sig Dispense Refill Last Dose   insulin lispro (HUMALOG) 100 UNIT/ML injection Inject 0.05 mLs (5 Units total) into the skin 3 (three) times daily with meals. 10 mL 11    insulin lispro (HUMALOG) 100 UNIT/ML injection Inject 0.05 mLs (5 Units total) into the skin 3 (three) times daily before meals. 10 mL 11    insulin NPH Human (HUMULIN N) 100 UNIT/ML injection Inject 0.15 mLs (15 Units total) into the skin 2 (two) times daily before a meal. 10 mL 11    Insulin Syringes, Disposable, U-100 0.3 ML MISC 5 Units by Does not apply route in the morning, at noon, and at bedtime. 1 each 11    metFORMIN (GLUCOPHAGE) 500 MG tablet Take 1 tablet (500 mg total) by mouth 2 (two) times daily with a meal. 60 tablet 11    pantoprazole (PROTONIX) 40 MG tablet Take 1  tablet (40 mg total) by mouth daily. 30 tablet 11    Prenatal Vit-Fe Fumarate-FA (PRENATAL MULTIVITAMIN) TABS tablet Take 1 tablet by mouth daily at 12 noon.        Physical Exam   Blood pressure (!) 155/88, pulse 80, temperature 97.8 F (36.6 C), temperature source Oral, resp. rate 15, last menstrual period 03/06/2020, SpO2 100 %. Patient Vitals for the past 24 hrs:  BP Temp Temp src Pulse Resp SpO2  11/20/20 0956 (!) 155/88 -- -- 80 -- --  11/20/20 0916 (!) 131/93 -- -- (!) 101 -- 100 %  11/20/20 0900 (!) 131/93 97.8 F (36.6 C) Oral 97 15 100 %     General appearance: alert, cooperative, and no distress Abdomen:  gravid Pelvic: external genitalia normal and gravid Extremities: extremities normal, atraumatic, no cyanosis or edema and no edema, redness or tenderness in the calves or thighs Skin: Skin color, texture, turgor normal. No rashes or lesions  Tracing> baseline 135-140 (+)  accel to 150 Irreg ctx ED Course  IMP: gestational HTN Class B DM IUP @ 37 wk P) PIH labs.  BPP MDM    Serita Kyle, MD 10:06 AM 11/20/2020

## 2020-11-20 NOTE — MAU Note (Signed)
.  Lisa Whitehead is a 25 y.o. at [redacted]w[redacted]d here in MAU reporting: sent in by MD for ultrasound and blood work. Denies VB or LOF. Endorses good fetal movement. Also having lower abdominal cramping.   Pain score:3  Vitals:   11/20/20 0900  BP: (!) 131/93  Pulse: 97  Resp: 15  Temp: 97.8 F (36.6 C)  SpO2: 100%     FHT:155 Lab orders placed from triage:  UA

## 2020-11-20 NOTE — Progress Notes (Signed)
S: notes painful ctx S/p cytotec x 2  O: Patient Vitals for the past 24 hrs:  BP Temp Temp src Pulse Resp SpO2 Height Weight  11/20/20 2101 120/82 -- -- 97 18 -- -- --  11/20/20 2001 (!) 141/96 -- -- 88 20 -- -- --  11/20/20 1901 123/86 99 F (37.2 C) Oral 97 18 -- -- --  11/20/20 1801 128/79 -- -- 93 16 -- -- --  11/20/20 1701 129/88 -- -- (!) 109 18 -- -- --  11/20/20 1601 124/87 98.6 F (37 C) Oral 100 15 -- -- --  11/20/20 1507 (!) 133/96 -- -- (!) 107 16 -- -- --  11/20/20 1401 115/83 -- -- (!) 112 18 -- -- --  11/20/20 1331 118/86 -- -- (!) 115 -- -- -- --  11/20/20 1321 112/76 -- -- (!) 109 16 -- -- --  11/20/20 1315 127/84 98.7 F (37.1 C) Oral (!) 107 16 -- -- --  11/20/20 1253 -- -- -- -- -- -- 5' (1.524 m) 69.4 kg  11/20/20 1242 117/84 -- -- (!) 102 16 -- -- --  11/20/20 1216 (!) 140/93 -- -- 90 -- 100 % -- --  11/20/20 1200 139/85 -- -- 93 -- 100 % -- --  11/20/20 1145 125/86 -- -- (!) 112 -- 100 % -- --  11/20/20 1131 123/86 -- -- (!) 107 -- 99 % -- --  11/20/20 1116 125/90 -- -- (!) 104 -- 100 % -- --  11/20/20 1101 139/83 -- -- 99 -- 100 % -- --  11/20/20 1046 121/84 -- -- (!) 107 -- 100 % -- --  11/20/20 1031 138/85 -- -- 84 -- 100 % -- --  11/20/20 1020 (!) 129/93 -- -- 100 -- 100 % -- --  11/20/20 0956 (!) 155/88 -- -- 80 -- -- -- --  11/20/20 0916 (!) 131/93 -- -- (!) 101 -- 100 % -- --  11/20/20 0900 (!) 131/93 97.8 F (36.6 C) Oral 97 15 100 % -- --  Ve 1/60/-2 Intracervical balloon placed Tracing baseline 130 (+)  150 Couplet ctx q 3 mins   IMP: preeclampsia on Magnesium sulfate Class B DM IUP @ 37 wk P) start pitocin. Cont CBG q 2 hrs. Cont Magnesium P)

## 2020-11-21 ENCOUNTER — Inpatient Hospital Stay (HOSPITAL_COMMUNITY): Payer: 59 | Admitting: Anesthesiology

## 2020-11-21 LAB — CBC WITH DIFFERENTIAL/PLATELET
Abs Immature Granulocytes: 0 10*3/uL (ref 0.00–0.07)
Abs Immature Granulocytes: 0.06 10*3/uL (ref 0.00–0.07)
Basophils Absolute: 0.1 10*3/uL (ref 0.0–0.1)
Basophils Absolute: 0 10*3/uL (ref 0.0–0.1)
Basophils Relative: 1 %
Basophils Relative: 0 %
Eosinophils Absolute: 0 10*3/uL (ref 0.0–0.5)
Eosinophils Absolute: 0 10*3/uL (ref 0.0–0.5)
Eosinophils Relative: 0 %
Eosinophils Relative: 0 %
HCT: 31.4 % — ABNORMAL LOW (ref 36.0–46.0)
HCT: 32.4 % — ABNORMAL LOW (ref 36.0–46.0)
Hemoglobin: 10.3 g/dL — ABNORMAL LOW (ref 12.0–15.0)
Hemoglobin: 10.5 g/dL — ABNORMAL LOW (ref 12.0–15.0)
Immature Granulocytes: 1 %
Lymphocytes Relative: 6 %
Lymphocytes Relative: 5 %
Lymphs Abs: 0.7 10*3/uL (ref 0.7–4.0)
Lymphs Abs: 0.6 10*3/uL — ABNORMAL LOW (ref 0.7–4.0)
MCH: 25.6 pg — ABNORMAL LOW (ref 26.0–34.0)
MCH: 25.4 pg — ABNORMAL LOW (ref 26.0–34.0)
MCHC: 32.8 g/dL (ref 30.0–36.0)
MCHC: 32.4 g/dL (ref 30.0–36.0)
MCV: 78.1 fL — ABNORMAL LOW (ref 80.0–100.0)
MCV: 78.5 fL — ABNORMAL LOW (ref 80.0–100.0)
Monocytes Absolute: 0.4 10*3/uL (ref 0.1–1.0)
Monocytes Absolute: 0.7 10*3/uL (ref 0.1–1.0)
Monocytes Relative: 3 %
Monocytes Relative: 6 %
Neutro Abs: 10.8 10*3/uL — ABNORMAL HIGH (ref 1.7–7.7)
Neutro Abs: 9.9 10*3/uL — ABNORMAL HIGH (ref 1.7–7.7)
Neutrophils Relative %: 90 %
Neutrophils Relative %: 88 %
Platelets: 183 10*3/uL (ref 150–400)
Platelets: 177 10*3/uL (ref 150–400)
RBC: 4.02 MIL/uL (ref 3.87–5.11)
RBC: 4.13 MIL/uL (ref 3.87–5.11)
RDW: 14.5 % (ref 11.5–15.5)
RDW: 14.6 % (ref 11.5–15.5)
WBC: 12 10*3/uL — ABNORMAL HIGH (ref 4.0–10.5)
WBC: 11.3 10*3/uL — ABNORMAL HIGH (ref 4.0–10.5)
nRBC: 0 % (ref 0.0–0.2)
nRBC: 0 /100 WBC
nRBC: 0 % (ref 0.0–0.2)

## 2020-11-21 LAB — MAGNESIUM
Magnesium: 5.8 mg/dL — ABNORMAL HIGH (ref 1.7–2.4)
Magnesium: 4.7 mg/dL — ABNORMAL HIGH (ref 1.7–2.4)

## 2020-11-21 LAB — RPR: RPR Ser Ql: NONREACTIVE

## 2020-11-21 LAB — GLUCOSE, CAPILLARY
Glucose-Capillary: 107 mg/dL — ABNORMAL HIGH (ref 70–99)
Glucose-Capillary: 111 mg/dL — ABNORMAL HIGH (ref 70–99)
Glucose-Capillary: 118 mg/dL — ABNORMAL HIGH (ref 70–99)
Glucose-Capillary: 115 mg/dL — ABNORMAL HIGH (ref 70–99)
Glucose-Capillary: 110 mg/dL — ABNORMAL HIGH (ref 70–99)
Glucose-Capillary: 129 mg/dL — ABNORMAL HIGH (ref 70–99)
Glucose-Capillary: 105 mg/dL — ABNORMAL HIGH (ref 70–99)

## 2020-11-21 LAB — GLUCOSE, RANDOM: Glucose, Bld: 115 mg/dL — ABNORMAL HIGH (ref 70–99)

## 2020-11-21 MED ORDER — DIPHENHYDRAMINE HCL 50 MG/ML IJ SOLN: 12.5000 mg | INTRAMUSCULAR | Status: DC | PRN

## 2020-11-21 MED ORDER — LACTATED RINGERS IV SOLN: 500.0000 mL | Freq: Once | INTRAVENOUS | Status: DC

## 2020-11-21 MED ORDER — MAGNESIUM SULFATE 40 GM/1000ML IV SOLN
1.0000 g/h | INTRAVENOUS | Status: DC
  Administered 2020-11-21 – 2020-11-22 (×2): 1 g/h via INTRAVENOUS
  Filled 2020-11-21: qty 1000

## 2020-11-21 MED ORDER — PHENYLEPHRINE 40 MCG/ML (10ML) SYRINGE FOR IV PUSH (FOR BLOOD PRESSURE SUPPORT): 80.0000 ug | PREFILLED_SYRINGE | INTRAVENOUS | Status: DC | PRN

## 2020-11-21 MED ORDER — EPHEDRINE 5 MG/ML INJ: 10.0000 mg | INTRAVENOUS | Status: DC | PRN

## 2020-11-21 MED ORDER — LIDOCAINE HCL (PF) 1 % IJ SOLN
INTRAMUSCULAR | Status: DC | PRN
  Administered 2020-11-21: 8 mL via EPIDURAL

## 2020-11-21 MED ORDER — FENTANYL-BUPIVACAINE-NACL 0.5-0.125-0.9 MG/250ML-% EP SOLN: 12.0000 mL/h | EPIDURAL | Status: DC | PRN

## 2020-11-21 NOTE — Anesthesia Procedure Notes (Signed)
Epidural Patient location during procedure: OB Start time: 11/21/2020 8:29 PM End time: 11/21/2020 8:34 PM  Staffing Anesthesiologist: Bethena Midget, MD  Preanesthetic Checklist Completed: patient identified, IV checked, site marked, risks and benefits discussed, surgical consent, monitors and equipment checked, pre-op evaluation and timeout performed  Epidural Patient position: sitting Prep: DuraPrep and site prepped and draped Patient monitoring: continuous pulse ox and blood pressure Approach: midline Location: L4-L5 Injection technique: LOR air  Needle:  Needle type: Tuohy  Needle gauge: 17 G Needle length: 9 cm and 9 Needle insertion depth: 6 cm Catheter type: closed end flexible Catheter size: 19 Gauge Catheter at skin depth: 12 cm Test dose: negative  Assessment Events: blood not aspirated, injection not painful, no injection resistance, no paresthesia and negative IV test

## 2020-11-21 NOTE — Progress Notes (Signed)
S: more comfortable Epidural  O: BP 114/74   Pulse 95   Temp 98.6 F (37 C) (Oral)   Resp 16   Ht 5' (1.524 m)   Wt 69.4 kg   LMP 03/06/2020   SpO2 97%   BMI 29.88 kg/m   Pitocin 24 mIU VE 4/70/-2  Midposition AROM copious clear AF ISE/IUPC placed Mag 4.7 Tracing: baseline 130 (+) accels (+) variables Ctx q 2 mins CBG 107 IMP:  Preeclampsia on Magnesium sulfate Class B DM Latent phase   GBS cx (+) on IV PCN  P) cont pitocin  Reverse cow girl position

## 2020-11-21 NOTE — Progress Notes (Signed)
S: mild contractions O: BP 126/76   Pulse 97   Temp 98.9 F (37.2 C) (Oral)   Resp 16   Ht 5' (1.524 m)   Wt 69.4 kg   LMP 03/06/2020   SpO2 100%   BMI 29.88 kg/m  CBG (last 3)  Recent Labs    11/21/20 0220 11/21/20 0415 11/21/20 0626  GLUCAP 111* 118* 115*   Ve 4/60/-3 applied  Tracing: baseline 130 (+) accel to 150 Ctx irreg CBC Latest Ref Rng & Units 11/21/2020 11/20/2020 11/20/2020  WBC 4.0 - 10.5 K/uL 12.0(H) 11.9(H) 10.0  Hemoglobin 12.0 - 15.0 g/dL 10.3(L) 10.4(L) 10.5(L)  Hematocrit 36.0 - 46.0 % 31.4(L) 31.7(L) 33.4(L)  Platelets 150 - 400 K/uL 183 181 167     IMP: latent phase Preeclampsia on magnesium GBS cx (+) IV PCN Class B DM IUP @ 37 1/7 wk P) left exaggerated sims. Cont pitocin. Check magnesium level

## 2020-11-21 NOTE — Anesthesia Preprocedure Evaluation (Signed)
Anesthesia Evaluation  Patient identified by MRN, date of birth, ID band Patient awake    Reviewed: Allergy & Precautions, H&P , NPO status , Patient's Chart, lab work & pertinent test results, reviewed documented beta blocker date and time   Airway Mallampati: II  TM Distance: >3 FB Neck ROM: full    Dental no notable dental hx. (+) Teeth Intact, Dental Advisory Given   Pulmonary neg pulmonary ROS,    Pulmonary exam normal breath sounds clear to auscultation       Cardiovascular hypertension, Pt. on medications Normal cardiovascular exam Rhythm:regular Rate:Normal     Neuro/Psych negative neurological ROS  negative psych ROS   GI/Hepatic negative GI ROS, Neg liver ROS,   Endo/Other  diabetes, Type 2  Renal/GU negative Renal ROS  negative genitourinary   Musculoskeletal   Abdominal   Peds  Hematology negative hematology ROS (+)   Anesthesia Other Findings   Reproductive/Obstetrics (+) Pregnancy                             Anesthesia Physical Anesthesia Plan  ASA: 3  Anesthesia Plan: Epidural   Post-op Pain Management:    Induction:   PONV Risk Score and Plan: 2  Airway Management Planned: Natural Airway  Additional Equipment: None  Intra-op Plan:   Post-operative Plan:   Informed Consent: I have reviewed the patients History and Physical, chart, labs and discussed the procedure including the risks, benefits and alternatives for the proposed anesthesia with the patient or authorized representative who has indicated his/her understanding and acceptance.       Plan Discussed with: Anesthesiologist and CRNA  Anesthesia Plan Comments:         Anesthesia Quick Evaluation

## 2020-11-21 NOTE — Progress Notes (Signed)
S: sleeping  O: BP 124/78   Pulse 86   Temp 98.6 F (37 C) (Oral)   Resp 16   Ht 5' (1.524 m)   Wt 69.4 kg   LMP 03/06/2020   SpO2 100%   BMI 29.88 kg/m   Pitocin 16 miu VE deferred Tracing: baseline 130 (+) accel  Ctx q 1- Magnesium 5.8 CBG (last 3)  Recent Labs    11/21/20 0626 11/21/20 1025 11/21/20 1225  GLUCAP 115* 110* 129*   IMP: latent phase Class B DM Preeclampsia on magnesium IUP @ 37 1/7 wk P) cont with pitocin. Will stop magnesium for one hour and decrease to 1g/hr

## 2020-11-21 NOTE — Progress Notes (Signed)
S: c/o painful contractions Desires epidural  O: BP (!) 149/83   Pulse (!) 105   Temp 98.6 F (37 C) (Oral)   Resp 16   Ht 5' (1.524 m)   Wt 69.4 kg   LMP 03/06/2020   SpO2 100%   BMI 29.88 kg/m  VE 4/80/-3 deviated to left Intact Tracing baseline 130 (+) accel 150 Ctx q 2-3 mins CBG (last 3)  Recent Labs    11/21/20 1025 11/21/20 1225 11/21/20 1423  GLUCAP 110* 129* 105*    IMP: latent phase GBS cx (+) on IV PCN Class B DM  Preeclampsia on magnesium P) epidural. Recheck magnesium level. Cont pitocin. Amniotomy prn

## 2020-11-22 ENCOUNTER — Encounter (HOSPITAL_COMMUNITY): Payer: Self-pay | Admitting: Obstetrics and Gynecology

## 2020-11-22 MED ORDER — SIMETHICONE 80 MG PO CHEW: 80.0000 mg | CHEWABLE_TABLET | ORAL | Status: DC | PRN

## 2020-11-22 MED ORDER — ZOLPIDEM TARTRATE 5 MG PO TABS
5.0000 mg | ORAL_TABLET | Freq: Every evening | ORAL | Status: DC | PRN
Start: 2020-11-22 — End: 2020-11-24

## 2020-11-22 MED ORDER — ONDANSETRON HCL 4 MG PO TABS: 4.0000 mg | ORAL_TABLET | ORAL | Status: DC | PRN

## 2020-11-22 MED ORDER — ACETAMINOPHEN 325 MG PO TABS
650.0000 mg | ORAL_TABLET | ORAL | Status: DC | PRN
  Administered 2020-11-22 – 2020-11-24 (×3): 650 mg via ORAL
  Filled 2020-11-22 (×3): qty 2

## 2020-11-22 MED ORDER — IBUPROFEN 600 MG PO TABS
600.0000 mg | ORAL_TABLET | Freq: Four times a day (QID) | ORAL | Status: DC
  Administered 2020-11-22 – 2020-11-24 (×9): 600 mg via ORAL
  Filled 2020-11-22 (×9): qty 1

## 2020-11-22 MED ORDER — PRENATAL MULTIVITAMIN CH
1.0000 | ORAL_TABLET | Freq: Every day | ORAL | Status: DC
  Administered 2020-11-22 – 2020-11-24 (×3): 1 via ORAL
  Filled 2020-11-22 (×3): qty 1

## 2020-11-22 MED ORDER — DIBUCAINE (PERIANAL) 1 % EX OINT: 1.0000 "application " | TOPICAL_OINTMENT | CUTANEOUS | Status: DC | PRN

## 2020-11-22 MED ORDER — FERROUS SULFATE 325 (65 FE) MG PO TABS
325.0000 mg | ORAL_TABLET | Freq: Two times a day (BID) | ORAL | Status: DC
  Administered 2020-11-22 – 2020-11-24 (×4): 325 mg via ORAL
  Filled 2020-11-22 (×4): qty 1

## 2020-11-22 MED ORDER — METFORMIN HCL 500 MG PO TABS
500.0000 mg | ORAL_TABLET | Freq: Two times a day (BID) | ORAL | Status: DC
  Administered 2020-11-22 – 2020-11-24 (×4): 500 mg via ORAL
  Filled 2020-11-22 (×4): qty 1

## 2020-11-22 MED ORDER — SENNOSIDES-DOCUSATE SODIUM 8.6-50 MG PO TABS
2.0000 | ORAL_TABLET | Freq: Every day | ORAL | Status: DC
  Administered 2020-11-23: 2 via ORAL
  Filled 2020-11-22: qty 2

## 2020-11-22 MED ORDER — ONDANSETRON HCL 4 MG/2ML IJ SOLN: 4.0000 mg | INTRAMUSCULAR | Status: DC | PRN

## 2020-11-22 MED ORDER — OXYCODONE HCL 5 MG PO TABS: 10.0000 mg | ORAL_TABLET | ORAL | Status: DC | PRN

## 2020-11-22 MED ORDER — INSULIN ASPART 100 UNIT/ML IJ SOLN
0.0000 [IU] | INTRAMUSCULAR | Status: DC
  Administered 2020-11-22 (×2): 2 [IU] via SUBCUTANEOUS

## 2020-11-22 MED ORDER — DIPHENHYDRAMINE HCL 25 MG PO CAPS: 25.0000 mg | ORAL_CAPSULE | Freq: Four times a day (QID) | ORAL | Status: DC | PRN

## 2020-11-22 MED ORDER — INSULIN GLARGINE 100 UNIT/ML ~~LOC~~ SOLN
10.0000 [IU] | Freq: Every day | SUBCUTANEOUS | Status: DC
  Administered 2020-11-22 – 2020-11-24 (×3): 10 [IU] via SUBCUTANEOUS
  Filled 2020-11-22 (×3): qty 0.1

## 2020-11-22 MED ORDER — BENZOCAINE-MENTHOL 20-0.5 % EX AERO
1.0000 | INHALATION_SPRAY | CUTANEOUS | Status: DC | PRN
Start: 2020-11-22 — End: 2020-11-24
  Filled 2020-11-22: qty 56

## 2020-11-22 MED ORDER — LACTATED RINGERS IV SOLN: INTRAVENOUS | Status: DC

## 2020-11-22 MED ORDER — WITCH HAZEL-GLYCERIN EX PADS: 1.0000 "application " | MEDICATED_PAD | CUTANEOUS | Status: DC | PRN

## 2020-11-22 MED ORDER — COCONUT OIL OIL: 1.0000 "application " | TOPICAL_OIL | Status: DC | PRN

## 2020-11-22 MED ORDER — OXYCODONE HCL 5 MG PO TABS
5.0000 mg | ORAL_TABLET | ORAL | Status: DC | PRN
Start: 2020-11-22 — End: 2020-11-24
  Administered 2020-11-24: 5 mg via ORAL
  Filled 2020-11-22: qty 1

## 2020-11-22 NOTE — Progress Notes (Signed)
Inpatient Diabetes Program Recommendations  AACE/ADA: New Consensus Statement on Inpatient Glycemic Control (2015)  Target Ranges:  Prepandial:   less than 140 mg/dL      Peak postprandial:   less than 180 mg/dL (1-2 hours)      Critically ill patients:  140 - 180 mg/dL   Lab Results  Component Value Date   GLUCAP 105 (H) 11/21/2020   HGBA1C 10.3 (H) 04/18/2020    Diabetes history: Type 2 DM Outpatient Diabetes medications: NPH 15 units BID, Humalog 5 units TID, Metformin 500 mg BID Current orders for Inpatient glycemic control: Metformin 500 mg BID  Inpatient Diabetes Program Recommendations:     Prior to pregnancy A1C >15%. Anticipate need for insulin outpatient.   Consider:  -Adding Novolog 0-9 units Q4H -Adding Lantus 10 units QD  Spoke with patient regarding outpatient diabetes management. Patient currently using Freestyle Libre and sees Dr Talmage Nap with outpatient endocrinology.  Reviewed patient's previous A1c of >15% in 10/2019, then 10.3% in December with need for repeat in 6-8 weeks post partum. Explained what a A1c is and what it measures. Also reviewed goal A1c with patient, importance of good glucose control @ home, and blood sugar goals. Reviewed patho of DM following pregnancy, decrease in insulin needs, hormonal fluctuations following delivery, survival skills, risk for hypoglycemia, vascular changes and commorbidities. Patient prefers vial/syringe.  Encouraged to continue being mindful of CHO diet and to make appointment with post partum follow up with Dr Talmage Nap. Reviewed when to call MD and to anticipate need for adjustment to insulin regimen. Patient wearing Freestyle Libre and plans to continue outpatient. Has no questions at this time.   Thanks, Lujean Rave, MSN, RNC-OB Diabetes Coordinator [phone] (8a-5p)

## 2020-11-22 NOTE — Anesthesia Postprocedure Evaluation (Signed)
Anesthesia Post Note  Patient: Lisa Whitehead  Procedure(s) Performed: AN AD HOC LABOR EPIDURAL     Patient location during evaluation: Mother Baby Anesthesia Type: Epidural Level of consciousness: awake and alert Pain management: pain level controlled Vital Signs Assessment: post-procedure vital signs reviewed and stable Respiratory status: spontaneous breathing, nonlabored ventilation and respiratory function stable Cardiovascular status: stable Postop Assessment: no headache, no backache and epidural receding Anesthetic complications: no   No notable events documented.  Last Vitals:  Vitals:   11/22/20 1600 11/22/20 2021  BP: 117/75 140/89  Pulse: 99 (!) 102  Resp: 18   Temp: 36.7 C 36.9 C  SpO2: 98%     Last Pain:  Vitals:   11/22/20 2000  TempSrc:   PainSc: 5                  Kathreen Dileo

## 2020-11-22 NOTE — Lactation Note (Addendum)
This note was copied from a baby's chart. Lactation Consultation Note  Patient Name: Lisa Whitehead PIRJJ'O Date: 11/22/2020 Reason for consult: L&D Initial assessment;1st time breastfeeding;Early term 37-38.6wks;Maternal endocrine disorder Age:27 hours  P1, [redacted]w[redacted]d.  Mother hx DM.  Grandmother holding infant.   Placed baby skin to skin on mother and attempted to latch. Baby opened briefly, guided baby onto nipple but "[name]" did not sustain latch. Left baby STS on mother's chest.   Discussed guiding baby back to breast if he cues.  Attempted to hand express but no drops expressed at this time.  Lactation to follow up on OBSC.  Maternal Data Does the patient have breastfeeding experience prior to this delivery?: No  Feeding  Attempt  Interventions Interventions: Education    Consult Status Consult Status: Follow-up Date: 11/22/20 Follow-up type: In-patient    Lisa Whitehead 11/22/2020, 8:44 AM

## 2020-11-22 NOTE — Lactation Note (Addendum)
This note was copied from a baby's chart. Lactation Consultation Note  Patient Name: Lisa Whitehead Date: 11/22/2020 Reason for consult: Initial assessment;Primapara;Early term 37-38.6wks;Infant < 6lbs;Maternal endocrine disorder Age:27 hours  LC in to visit with P1 Mom of ET infant.  Baby < 6 lbs and Mom is a Type 1 Diabetic on insulin and metformin.  Mom is not interested in latching baby to breast, but will do STS.  First CBG 57.  Mom plans to pump and bottle feed baby.    DEBP set up and encouraged Mom to pump whenever baby is fed, or goal of 8 times per 24 hrs.  Cleaning and drying station set up with instruction on importance of disassembling pump parts for this.   Mom states she ordered her DEBP from insurance and was notified it would be delivered today.  Mom not sure of brand.  Mom told of pump rentals in gift shop.    Lactation brochure provided to Mom.  Encouraged Mom to ask for assistance prn.    Maternal Data Has patient been taught Hand Expression?: Yes Does the patient have breastfeeding experience prior to this delivery?: No  Feeding Mother's Current Feeding Choice: Breast Milk  Lactation Tools Discussed/Used Tools: Pump;Flanges Flange Size: 24 Breast pump type: Double-Electric Breast Pump Pump Education: Setup, frequency, and cleaning;Milk Storage Reason for Pumping: Support milk supply/ET infant/<6 lbs/Mom's choice to pump and bottle feed Pumping frequency: Mom encouraged to pump with each feeding or 8 times per 24 hrs  Interventions Interventions: Breast feeding basics reviewed;Skin to skin;Breast massage;Hand express;Hand pump;DEBP;Education  Discharge Pump: Personal [employer] DEBP should be delivered to home today) WIC Program: No  Consult Status Consult Status: Follow-up Date: 11/23/20 Follow-up type: In-patient    Lisa Whitehead 11/22/2020, 11:06 AM

## 2020-11-23 LAB — CBC
HCT: 29.6 % — ABNORMAL LOW (ref 36.0–46.0)
Hemoglobin: 9.9 g/dL — ABNORMAL LOW (ref 12.0–15.0)
MCH: 26.1 pg (ref 26.0–34.0)
MCHC: 33.4 g/dL (ref 30.0–36.0)
MCV: 78.1 fL — ABNORMAL LOW (ref 80.0–100.0)
Platelets: 154 10*3/uL (ref 150–400)
RBC: 3.79 MIL/uL — ABNORMAL LOW (ref 3.87–5.11)
RDW: 14.6 % (ref 11.5–15.5)
WBC: 9.1 10*3/uL (ref 4.0–10.5)
nRBC: 0 % (ref 0.0–0.2)

## 2020-11-23 LAB — HEMOGLOBIN A1C
Hgb A1c MFr Bld: 6.4 % — ABNORMAL HIGH (ref 4.8–5.6)
Mean Plasma Glucose: 137 mg/dL

## 2020-11-23 MED ORDER — LOPERAMIDE HCL 2 MG PO CAPS
2.0000 mg | ORAL_CAPSULE | ORAL | Status: DC | PRN
  Administered 2020-11-23 – 2020-11-24 (×2): 2 mg via ORAL
  Filled 2020-11-23 (×2): qty 1

## 2020-11-23 NOTE — Progress Notes (Signed)
Inpatient Diabetes Program Recommendations  AACE/ADA: New Consensus Statement on Inpatient Glycemic Control (2015)  Target Ranges:  Prepandial:   less than 140 mg/dL      Peak postprandial:   less than 180 mg/dL (1-2 hours)      Critically ill patients:  140 - 180 mg/dL   Lab Results  Component Value Date   GLUCAP 105 (H) 11/21/2020   HGBA1C 6.4 (H) 11/21/2020     Inpatient Diabetes Program Recommendations:     NPH 10 units QD vial at discharge-Order # [id number] Syringes-order # 0.3 [id number]  Please follow up with Dr. Talmage Nap within 2 weeks.   Will continue to follow while inpatient.  Thank you, Dulce Sellar, RN, BSN Diabetes Coordinator Inpatient Diabetes Program [phone] (team pager from 8a-5p)

## 2020-11-23 NOTE — Progress Notes (Signed)
Freestyle CBG  [id number]

## 2020-11-23 NOTE — Progress Notes (Signed)
PPD1 SVD:   S:  Pt reports feeling well/ Tolerating po/ Voiding without problems/ No n/v/ Bleeding is light/ Pain controlled withprescription NSAID's including motrin  Newborn info live female   O:  A & O x 3 / VS: Blood pressure 124/86, pulse (!) 102, temperature 98.2 F (36.8 C), temperature source Oral, resp. rate 16, height 5' (1.524 m), weight 69.4 kg, last menstrual period 03/06/2020, SpO2 99 %, unknown if currently breastfeeding.  LABS:  Results for orders placed or performed during the hospital encounter of 11/20/20 (from the past 24 hour(s))  CBC     Status: Abnormal   Collection Time: 11/23/20  4:59 AM  Result Value Ref Range   WBC 9.1 4.0 - 10.5 K/uL   RBC 3.79 (L) 3.87 - 5.11 MIL/uL   Hemoglobin 9.9 (L) 12.0 - 15.0 g/dL   HCT 36.1 (L) 44.3 - 15.4 %   MCV 78.1 (L) 80.0 - 100.0 fL   MCH 26.1 26.0 - 34.0 pg   MCHC 33.4 30.0 - 36.0 g/dL   RDW 00.8 67.6 - 19.5 %   Platelets 154 150 - 400 K/uL   nRBC 0.0 0.0 - 0.2 %    I&O: I/O last 3 completed shifts: In: 4444.6 [P.O.:840; I.V.:3440.4; Other:114.2; IV Piggyback:50] Out: 4050 [Urine:3800; Emesis/NG output:50; Blood:200]   No intake/output data recorded.  Lungs: chest clear, no wheezing, rales, normal symmetric air entry  Heart: regular rate and rhythm, S1, S2 normal, no murmur, click, rub or gallop  Abdomen: uterus firm @ umb  Perineum: healing with good reapproximation  Lochia: just changed  Extremities:no redness or tenderness in the calves or thighs, no edema      A/P: PPD # 1/ G1P1001  S/p VAVD Class B DM  on insulin, metformin Preeclampsia on magnesium sulfate  Doing well  Continue routine post partum orders D/c Magnesium sulfate Cont BS monitor

## 2020-11-23 NOTE — Lactation Note (Signed)
This note was copied from a baby's chart. Lactation Consultation Note  Patient Name: Lisa Whitehead DPOEU'M Date: 11/23/2020 Reason for consult: Follow-up assessment Age:27 hours  LC in to room for follow up. Mother reports pumping only twice but discouraged with volume collected. Encouraged mother to pump at least 8 times a day including night time. Reinforced the importance of self-care and its influence in milk supply. Mother verbalizes in agreement.   Encouraged to request [facility] for any needs, support or questions.    Feeding Mother's Current Feeding Choice: Breast Milk and Donor Milk Nipple Type: Slow - flow  Lactation Tools Discussed/Used Tools: Pump;Flanges Flange Size: 24 Breast pump type: Double-Electric Breast Pump Reason for Pumping: stimulation and supplementation Pumping frequency: 8-12 x 24h  Interventions Interventions: Breast feeding basics reviewed;Breast massage;Hand express;Expressed milk;DEBP;Education  Consult Status Consult Status: Follow-up Date: 11/24/20 Follow-up type: In-patient    Zani Kyllonen A Higuera Ancidey 11/23/2020, 6:29 PM

## 2020-11-24 MED ORDER — IBUPROFEN 600 MG PO TABS: 600.0000 mg | ORAL_TABLET | Freq: Four times a day (QID) | ORAL | 11 refills | Status: AC | PRN

## 2020-11-24 NOTE — Progress Notes (Signed)
Pt given discharge instructions and all questions answered. IV discontinued.  

## 2020-11-24 NOTE — Discharge Summary (Signed)
Postpartum Discharge Summary  Date of Service updated     Patient Name: Lisa Whitehead DOB: 1995/10/05 MRN: [id number]  Date of admission: 11/20/2020 Delivery date:11/22/2020  Delivering provider: Yigit Norkus  Date of discharge: 11/24/2020  Admitting diagnosis: Preeclampsia in third trimester Class B DM IUP @ 37 wk Intrauterine pregnancy: 37wk    Secondary diagnosis:  Group B strep positive Additional problems: none    Discharge diagnosis: Term Pregnancy Delivered, Preeclampsia (mild), Type 2 DM, and Anemia , GBS positive                                             Post partum procedures: none Augmentation: AROM Complications: None  Hospital course: Induction of Labor With Vaginal Delivery   25 y.o. yo G1P1001 at 61w2dwas admitted to the hospital 11/20/2020 for induction of labor.  Indication for induction: Preeclampsia.  Patient had a protracted  labor course as follows: 1) magnesium sulfate, IV PCN. Oral cytotec for cervical ripening, IP foley balloon, Pitocin Membrane Rupture Time/Date: 9:58 PM ,11/21/2020   artificial Delivery Method:Vaginal, Vacuum (Extractor)  for fetal heart rate deceleration Episiotomy: Median  Lacerations:  None  Details of delivery can be found in separate delivery note.  Patient had a routine postpartum course. Patient is discharged home on 11/24/2021  Newborn Data: Birth date:11/22/2020  Birth time:7:34 AM  Gender:Female  Living status:Living  Apgars:7 ,8  Weight:2570 g   Circ done  Magnesium Sulfate received: Yes: Seizure prophylaxis BMZ received: No Rhophylac:No MMR:No T-DaP:Given prenatally Flu: No Transfusion:No  Physical exam  Vitals:   11/23/20 1623 11/23/20 2008 11/24/20 0010 11/24/20 0400  BP: 132/85 131/89 (!) 150/81 134/78  Pulse: (!) 112 (!) 110 (!) 104 97  Resp: _0 Temp: 98.1 F (36.7 C) 98.5 F (36.9 C) 98 F (36.7 C) 97.8 F (36.6 C)  TempSrc: Oral Oral Oral Oral  SpO2: 97% 99%  97%  Weight:       Height:       General: alert, cooperative, and no distress Lochia: appropriate Uterine Fundus: firm Incision: N/A DVT Evaluation: No evidence of DVT seen on physical exam. No significant calf/ankle edema. Labs: Lab Results  Component Value Date   WBC 9.1 11/23/2020   HGB 9.9 (L) 11/23/2020   HCT 29.6 (L) 11/23/2020   MCV 78.1 (L) 11/23/2020   PLT 154 11/23/2020   CMP Latest Ref Rng & Units 11/21/2020  Glucose 70 - 99 mg/dL 115(H)  BUN 6 - 20 mg/dL -  Creatinine 0.44 - 1.00 mg/dL -  Sodium 135 - 145 mmol/L -  Potassium 3.5 - 5.1 mmol/L -  Chloride 98 - 111 mmol/L -  CO2 22 - 32 mmol/L -  Calcium 8.9 - 10.3 mg/dL -  Total Protein 6.5 - 8.1 g/dL -  Total Bilirubin 0.3 - 1.2 mg/dL -  Alkaline Phos 38 - 126 U/L -  AST 15 - 41 U/L -  ALT 0 - 44 U/L -   Edinburgh Score: No flowsheet data found.    After visit meds:  Allergies as of 11/24/2020   No Known Allergies      Medication List     TAKE these medications    ibuprofen 600 MG tablet Commonly known as: ADVIL Take 1 tablet (600 mg total) by mouth every 6 (six) hours as needed.   insulin  lispro 100 UNIT/ML injection Commonly known as: HumaLOG Inject 0.05 mLs (5 Units total) into the skin 3 (three) times daily with meals.   insulin NPH Human 100 UNIT/ML injection Commonly known as: HumuLIN N Inject 0.15 mLs (15 Units total) into the skin 2 (two) times daily before a meal.   Insulin Syringes (Disposable) U-100 0.3 ML Misc 5 Units by Does not apply route in the morning, at noon, and at bedtime.   metFORMIN 500 MG tablet Commonly known as: GLUCOPHAGE Take 1 tablet (500 mg total) by mouth 2 (two) times daily with a meal.   pantoprazole 40 MG tablet Commonly known as: Protonix Take 1 tablet (40 mg total) by mouth daily.   prenatal multivitamin Tabs tablet Take 1 tablet by mouth daily at 12 noon.         Discharge home in stable condition Infant Feeding: Bottle and Breast Infant Disposition:home  with mother Discharge instruction: per After Visit Summary and Postpartum booklet. Activity: Advance as tolerated. Pelvic rest for 6 weeks.  Diet: carb modified diet Anticipated Birth Control: Unsure Postpartum Appointment:6 weeks Additional Postpartum F/U:  with Dr Balan Future Appointments:No future appointments. Follow up Visit:  Follow-up Information     Cousins, Sheronette, MD Follow up in 6 week(s).   Specialty: Obstetrics and Gynecology Contact information: [address] [place]  [address] [phone]                     11/24/2020 Sheronette A Cousins, MD   

## 2020-11-24 NOTE — Progress Notes (Signed)
Hypoglycemic Event  CBG: 61  Treatment: 8 oz juice/soda and crackers  Symptoms: None  Follow-up CBG: Time:0415 CBG Result:61  Possible Reasons for Event: Inadequate meal intake   CBG: 61 Follow up   Treatment: 4 oz juice/soda and pretzels  Symptoms: None  Follow-up CBG: Time:0435 CBG Result:82  Possible Reasons for Event: Inadequate meal intake  Comments/MD notified: Dr. Cherly Hensen notified, no new orders   Jullia Mulligan M Joella Saefong

## 2020-11-24 NOTE — Progress Notes (Signed)
PPD2 SVD:   S:  Pt reports feeling well/ Tolerating po/ Voiding without problems/ No n/v/ Bleeding is light/ Pain controlled withprescription NSAID's including ibuprofen (Motrin) and narcotic analgesics including oxycodone/acetaminophen (Percocet, Tylox)  Newborn info live female eating better   O:  A & O x 3 / VS: Blood pressure 134/78, pulse 97, temperature 97.8 F (36.6 C), temperature source Oral, resp. rate 18, height 5' (1.524 m), weight 69.4 kg, last menstrual period 03/06/2020, SpO2 97 %, unknown if currently breastfeeding.  LABS: No results found for this or any previous visit (from the past 24 hour(s)).  I&O: I/O last 3 completed shifts: In: 2124.3 [P.O.:720; I.V.:1404.3] Out: 3050 [Urine:3050]   No intake/output data recorded.  Lungs: chest clear, no wheezing, rales, normal symmetric air entry  Heart: regular rate and rhythm, S1, S2 normal, no murmur, click, rub or gallop  Abdomen: soft uterus firm 2FB below umb  Perineum: healing with good reapproximation  Lochia: light Extremities:no redness or tenderness in the calves or thighs, no edema    A/P: PPD # 2/ G1P1001 S/P VAVD Class B DM  on insulin/metformin   Doing well  Continue routine post partum orders  D/c home  D/c instructions reviewed  Cont insulin regimen. F/u with Dr Talmage Nap F/u 6 wk Pt has medications( insulin/metformin)

## 2020-11-24 NOTE — Progress Notes (Signed)
Inpatient Diabetes Program Recommendations  AACE/ADA: New Consensus Statement on Inpatient Glycemic Control (2015)  Target Ranges:  Prepandial:   less than 140 mg/dL      Peak postprandial:   less than 180 mg/dL (1-2 hours)      Critically ill patients:  140 - 180 mg/dL   Lab Results  Component Value Date   GLUCAP 105 (H) 11/21/2020   HGBA1C 6.4 (H) 11/21/2020    Review of Glycemic Control  Diabetes history: Type 2 DM Outpatient Diabetes medications: NPH 15 units BID, Humalog 5 units TID, Metformin 500 mg BID Current orders for Inpatient glycemic control: Metformin 500 mg BID, Lantus 10 units QD   Inpatient Diabetes Program Recommendations:    Noted mild hypoglycemia this AM of 61 mg/dL, assuming from patient's Freestyle Libre? Of note, patient did not have symptoms and was not verified by fingerstick.   At discharge consider NPH 8 units QD and continue Metformin. Order NPH vial # [id number] Syringes-order # 0.3 [id number] Thanks, Lujean Rave, MSN, RNC-OB Diabetes Coordinator [phone] (8a-5p)

## 2020-12-02 ENCOUNTER — Telehealth (HOSPITAL_COMMUNITY): Payer: Self-pay | Admitting: *Deleted

## 2020-12-02 NOTE — Telephone Encounter (Signed)
Mom reports feeling light-headed since baby's birth. Made a call to [facility] office and is waiting to hear back. Reports she's staying hydrated. EPDS = 2 (hospital score = 2) No other concerns about herself. Mom reports baby is doing very well. Breastfeeding and formula feeding without difficulty. No concerns for baby. Duffy Rhody, RN 12/02/2020 at 2:29pm

## 2022-05-23 IMAGING — US US OB COMP LESS 14 WK
1 series · 15 of 28 positions shown · non-contrast
Comparison: None.

CLINICAL DATA: Uncontrolled diabetes

EXAM:
OBSTETRIC <14 WK US AND TRANSVAGINAL OB US
TECHNIQUE: Both transabdominal and transvaginal ultrasound examinations were
performed for complete evaluation of the gestation as well as the
maternal uterus, adnexal regions, and pelvic cul-de-sac.
Transvaginal technique was performed to assess early pregnancy.

[Series 1: us ob comp less 14 wk · 15 of 43 slices shown]
[im 1/43]
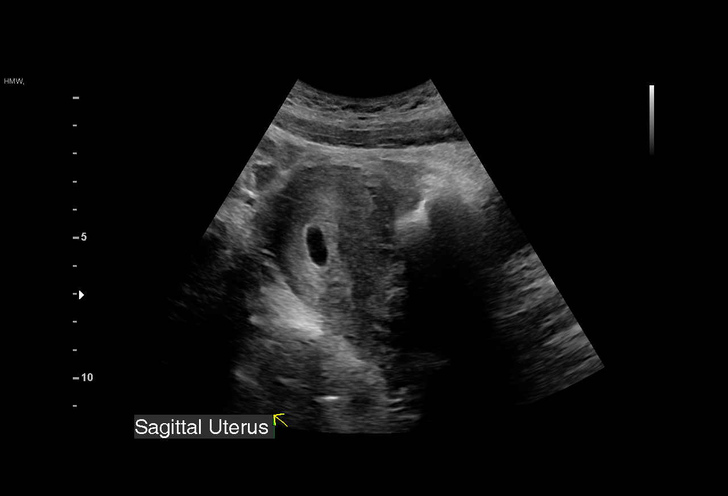
[im 4/43]
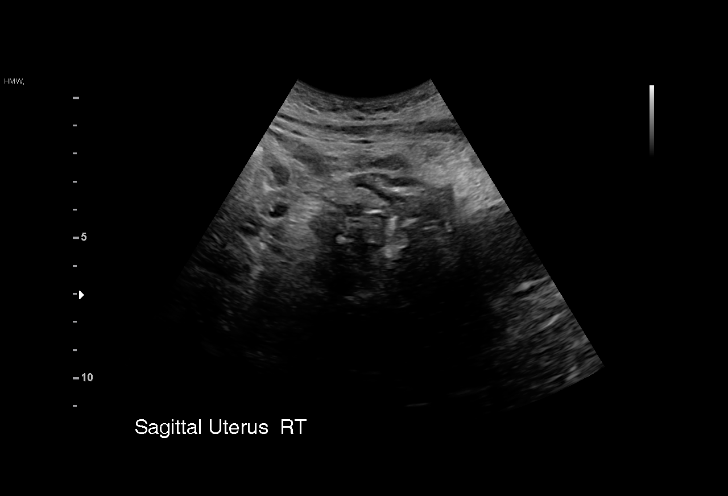
[im 7/43]
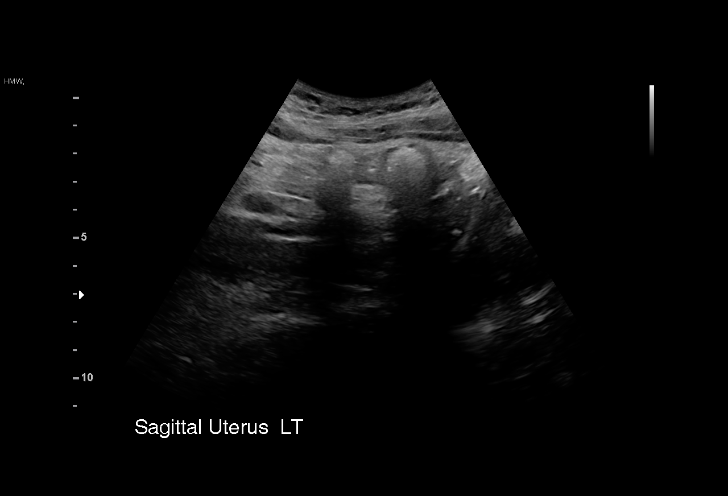
[im 10/43]
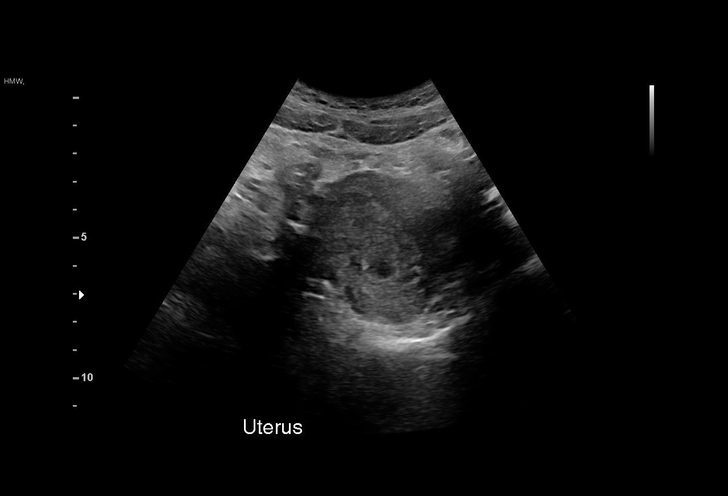
[im 13/43]
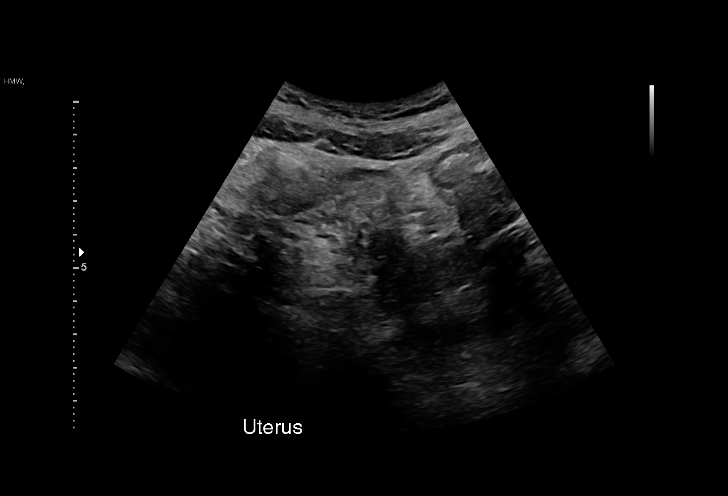
[im 16/43]
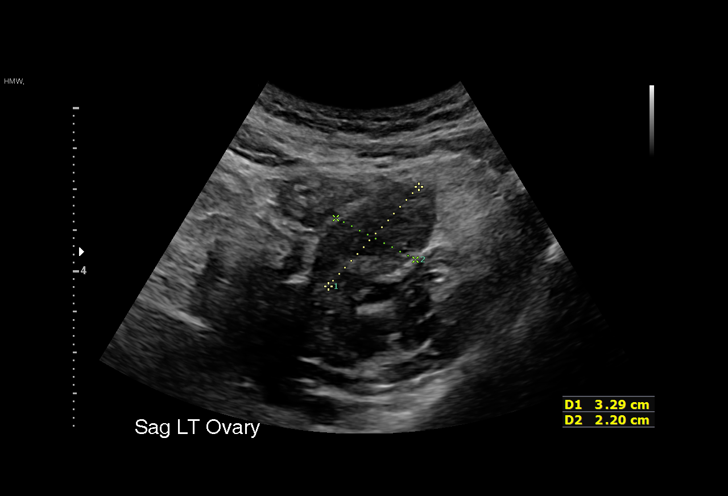
[im 19/43]
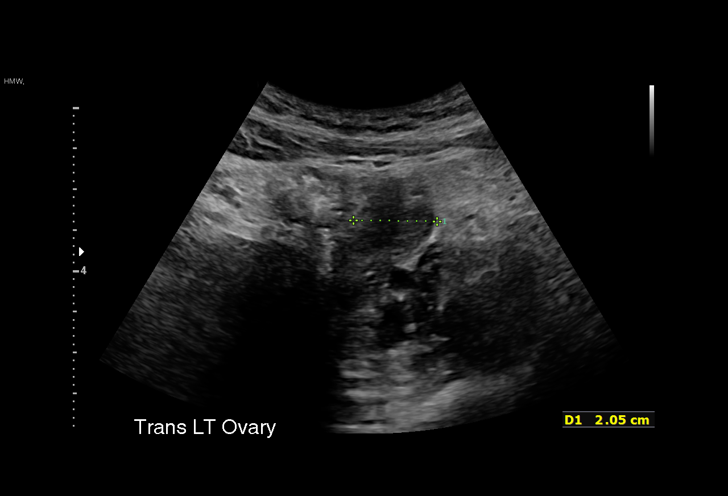
[im 22/43]
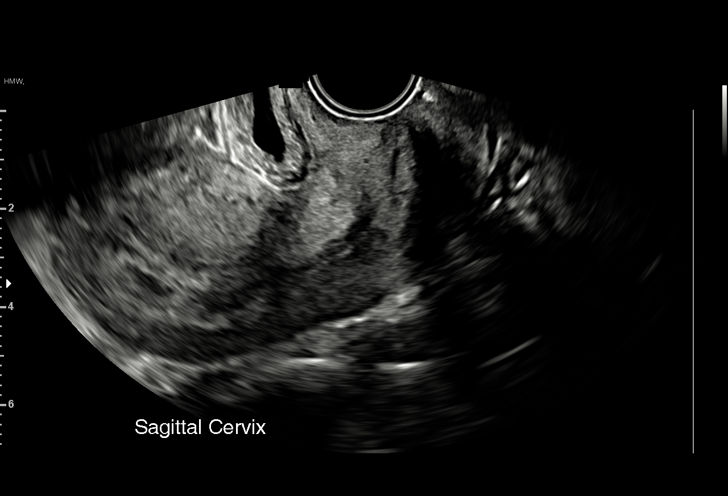
[im 24/43]
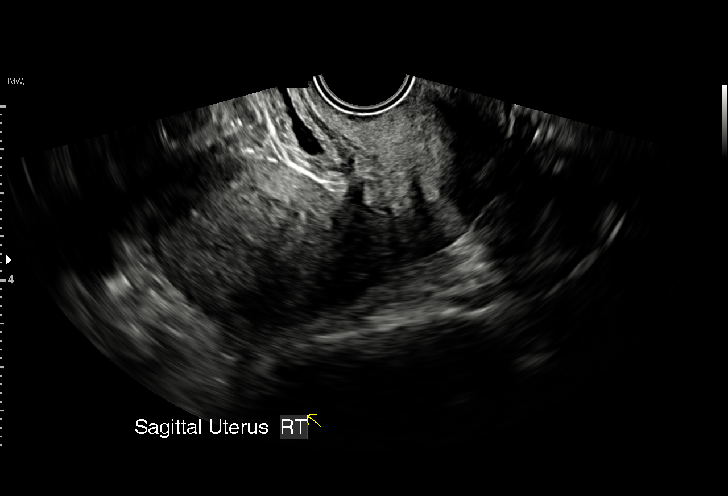
[im 27/43]
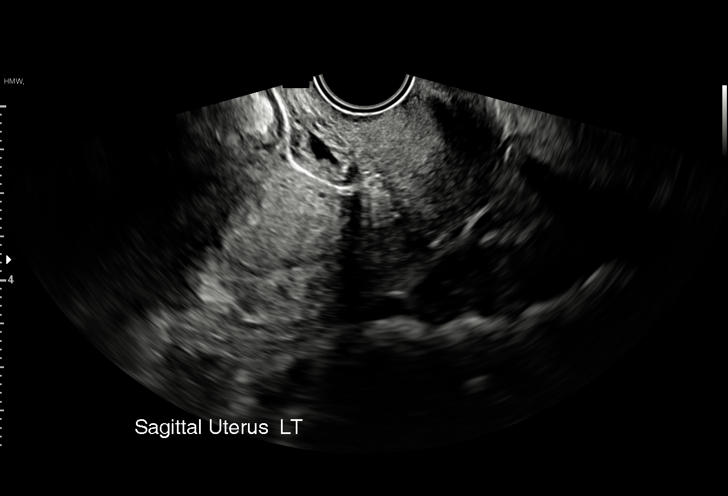
[im 30/43]
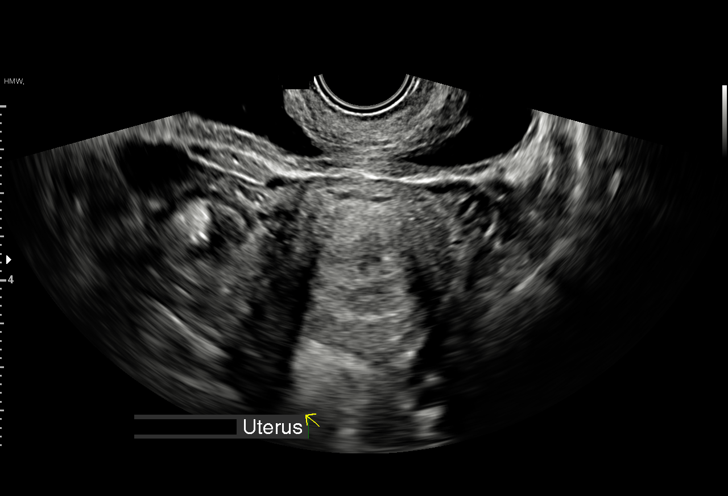
[im 33/43]
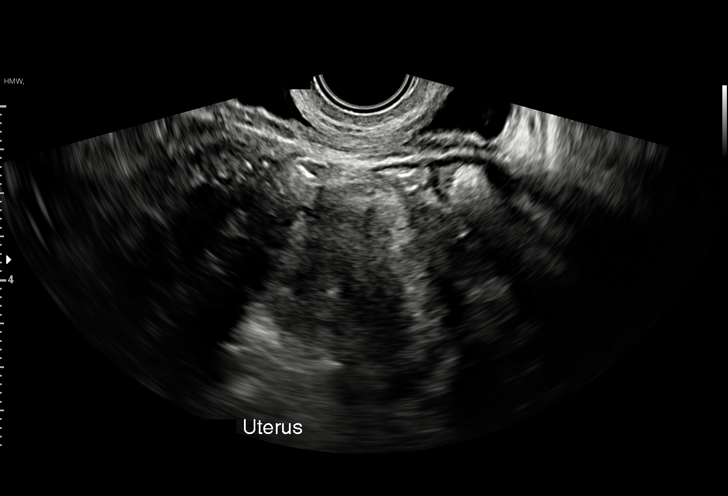
[im 36/43]
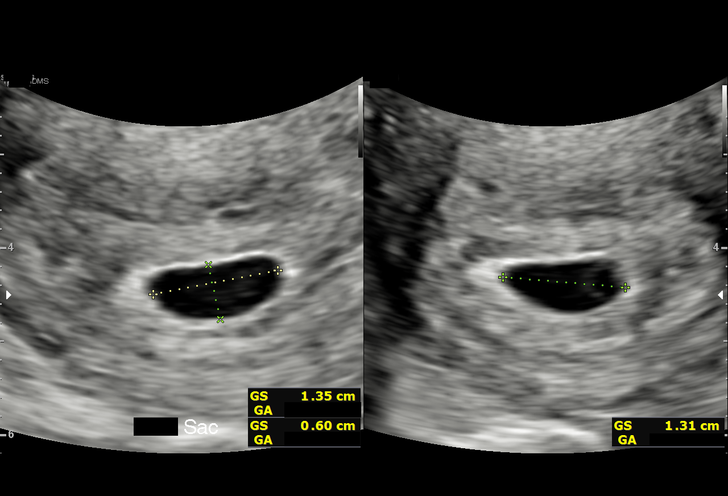
[im 39/43]
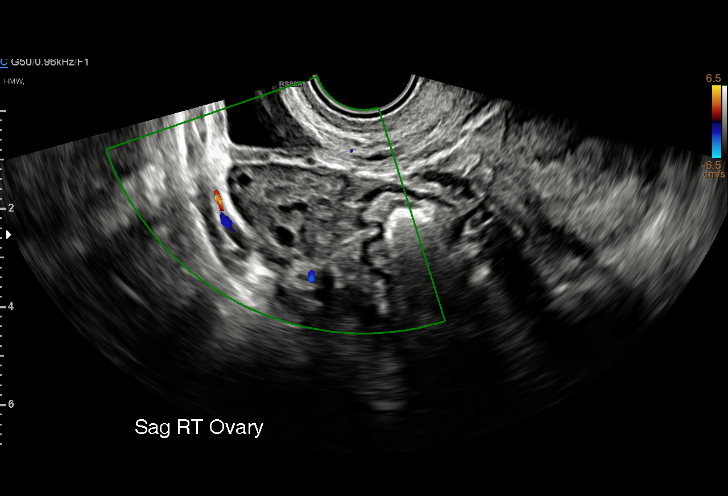
[im 43/43]
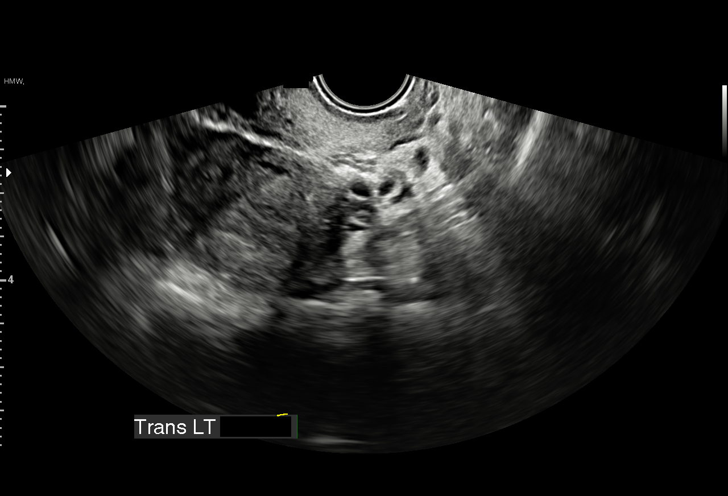

[15 of 28 positions shown; findings below may reference images not displayed]

FINDINGS: Intrauterine gestational sac: Single

Yolk sac:  Visualized

Embryo:  Not visualized

Cardiac Activity: Not visualized

Heart Rate:   bpm

MSD: 10.9 mm   5 w   5 d

CRL:    mm    w    d                  US EDC:

Subchorionic hemorrhage:  None visualized.

Maternal uterus/adnexae: No adnexal mass.  Trace free fluid.
IMPRESSION: Early intrauterine gestational sac with yolk sac. No fetal pole
currently. This could be followed with repeat ultrasound in 14 days
to ensure expected progression. No acute maternal findings.
